# Patient Record
Sex: Female | Born: 1955
Health system: Southern US, Community
[De-identification: ages and names within clinical notes are randomized; demographics above are authoritative.]

## PROBLEM LIST (undated history)

## (undated) DIAGNOSIS — E785 Hyperlipidemia, unspecified: Secondary | ICD-10-CM

## (undated) DIAGNOSIS — K5909 Other constipation: Secondary | ICD-10-CM

## (undated) HISTORY — PX: OTHER SURGICAL HISTORY: SHX169

## (undated) HISTORY — PX: PARTIAL HYSTERECTOMY: SHX80

## (undated) HISTORY — PX: ABDOMINAL HYSTERECTOMY: SHX81

## (undated) HISTORY — PX: CATARACT EXTRACTION, BILATERAL: SHX1313

## (undated) HISTORY — DX: Other constipation: K59.09

---

## 2000-12-19 ENCOUNTER — Encounter: Payer: Self-pay | Admitting: Neurosurgery

## 2000-12-19 ENCOUNTER — Inpatient Hospital Stay (HOSPITAL_COMMUNITY): Admission: AD | Admit: 2000-12-19 | Discharge: 2000-12-20 | Payer: Self-pay | Admitting: Neurosurgery

## 2001-01-05 ENCOUNTER — Encounter: Payer: Self-pay | Admitting: Neurosurgery

## 2001-01-05 ENCOUNTER — Encounter: Admission: RE | Admit: 2001-01-05 | Discharge: 2001-01-05 | Payer: Self-pay | Admitting: Neurosurgery

## 2001-02-09 ENCOUNTER — Encounter: Payer: Self-pay | Admitting: Neurosurgery

## 2001-02-09 ENCOUNTER — Encounter: Admission: RE | Admit: 2001-02-09 | Discharge: 2001-02-09 | Payer: Self-pay | Admitting: Neurosurgery

## 2001-03-19 ENCOUNTER — Encounter (HOSPITAL_COMMUNITY): Admission: RE | Admit: 2001-03-19 | Discharge: 2001-04-18 | Payer: Self-pay | Admitting: Neurosurgery

## 2001-06-03 ENCOUNTER — Encounter: Payer: Self-pay | Admitting: *Deleted

## 2001-06-03 ENCOUNTER — Ambulatory Visit (HOSPITAL_COMMUNITY): Admission: RE | Admit: 2001-06-03 | Discharge: 2001-06-03 | Payer: Self-pay | Admitting: *Deleted

## 2001-12-23 ENCOUNTER — Encounter: Payer: Self-pay | Admitting: Specialist

## 2001-12-23 ENCOUNTER — Ambulatory Visit (HOSPITAL_COMMUNITY): Admission: RE | Admit: 2001-12-23 | Discharge: 2001-12-23 | Payer: Self-pay | Admitting: Specialist

## 2002-12-24 ENCOUNTER — Encounter: Payer: Self-pay | Admitting: Specialist

## 2002-12-24 ENCOUNTER — Ambulatory Visit (HOSPITAL_COMMUNITY): Admission: RE | Admit: 2002-12-24 | Discharge: 2002-12-24 | Payer: Self-pay | Admitting: Specialist

## 2003-10-18 ENCOUNTER — Observation Stay (HOSPITAL_COMMUNITY): Admission: EM | Admit: 2003-10-18 | Discharge: 2003-10-19 | Payer: Self-pay | Admitting: Emergency Medicine

## 2003-10-19 ENCOUNTER — Emergency Department (HOSPITAL_COMMUNITY): Admission: EM | Admit: 2003-10-19 | Discharge: 2003-10-20 | Payer: Self-pay | Admitting: *Deleted

## 2003-12-05 ENCOUNTER — Ambulatory Visit (HOSPITAL_COMMUNITY): Admission: RE | Admit: 2003-12-05 | Discharge: 2003-12-05 | Payer: Self-pay | Admitting: Internal Medicine

## 2003-12-05 HISTORY — PX: COLONOSCOPY: SHX174

## 2004-03-28 ENCOUNTER — Ambulatory Visit (HOSPITAL_COMMUNITY): Admission: RE | Admit: 2004-03-28 | Discharge: 2004-03-28 | Payer: Self-pay | Admitting: Specialist

## 2004-04-04 ENCOUNTER — Ambulatory Visit (HOSPITAL_COMMUNITY): Admission: RE | Admit: 2004-04-04 | Discharge: 2004-04-04 | Payer: Self-pay | Admitting: Internal Medicine

## 2005-04-03 ENCOUNTER — Ambulatory Visit (HOSPITAL_COMMUNITY): Admission: RE | Admit: 2005-04-03 | Discharge: 2005-04-03 | Payer: Self-pay | Admitting: Specialist

## 2006-04-07 ENCOUNTER — Ambulatory Visit (HOSPITAL_COMMUNITY): Admission: RE | Admit: 2006-04-07 | Discharge: 2006-04-07 | Payer: Self-pay | Admitting: Specialist

## 2007-04-13 ENCOUNTER — Ambulatory Visit (HOSPITAL_COMMUNITY): Admission: RE | Admit: 2007-04-13 | Discharge: 2007-04-13 | Payer: Self-pay | Admitting: Specialist

## 2007-05-22 ENCOUNTER — Ambulatory Visit (HOSPITAL_COMMUNITY): Admission: RE | Admit: 2007-05-22 | Discharge: 2007-05-22 | Payer: Self-pay | Admitting: Family Medicine

## 2007-10-10 ENCOUNTER — Ambulatory Visit (HOSPITAL_COMMUNITY): Admission: RE | Admit: 2007-10-10 | Discharge: 2007-10-10 | Payer: Self-pay | Admitting: Family Medicine

## 2008-04-13 ENCOUNTER — Ambulatory Visit (HOSPITAL_COMMUNITY): Admission: RE | Admit: 2008-04-13 | Discharge: 2008-04-13 | Payer: Self-pay | Admitting: Obstetrics & Gynecology

## 2008-04-18 ENCOUNTER — Ambulatory Visit (HOSPITAL_COMMUNITY): Admission: RE | Admit: 2008-04-18 | Discharge: 2008-04-18 | Payer: Self-pay | Admitting: Obstetrics & Gynecology

## 2008-06-14 IMAGING — CR DG CHEST 2V
2 series · 2 of 2 positions shown · non-contrast
Comparison: None.

CLINICAL DATA: Shortness of breath and cough.
CHEST ? 2 VIEW:

[view not recorded (1 of 2)]
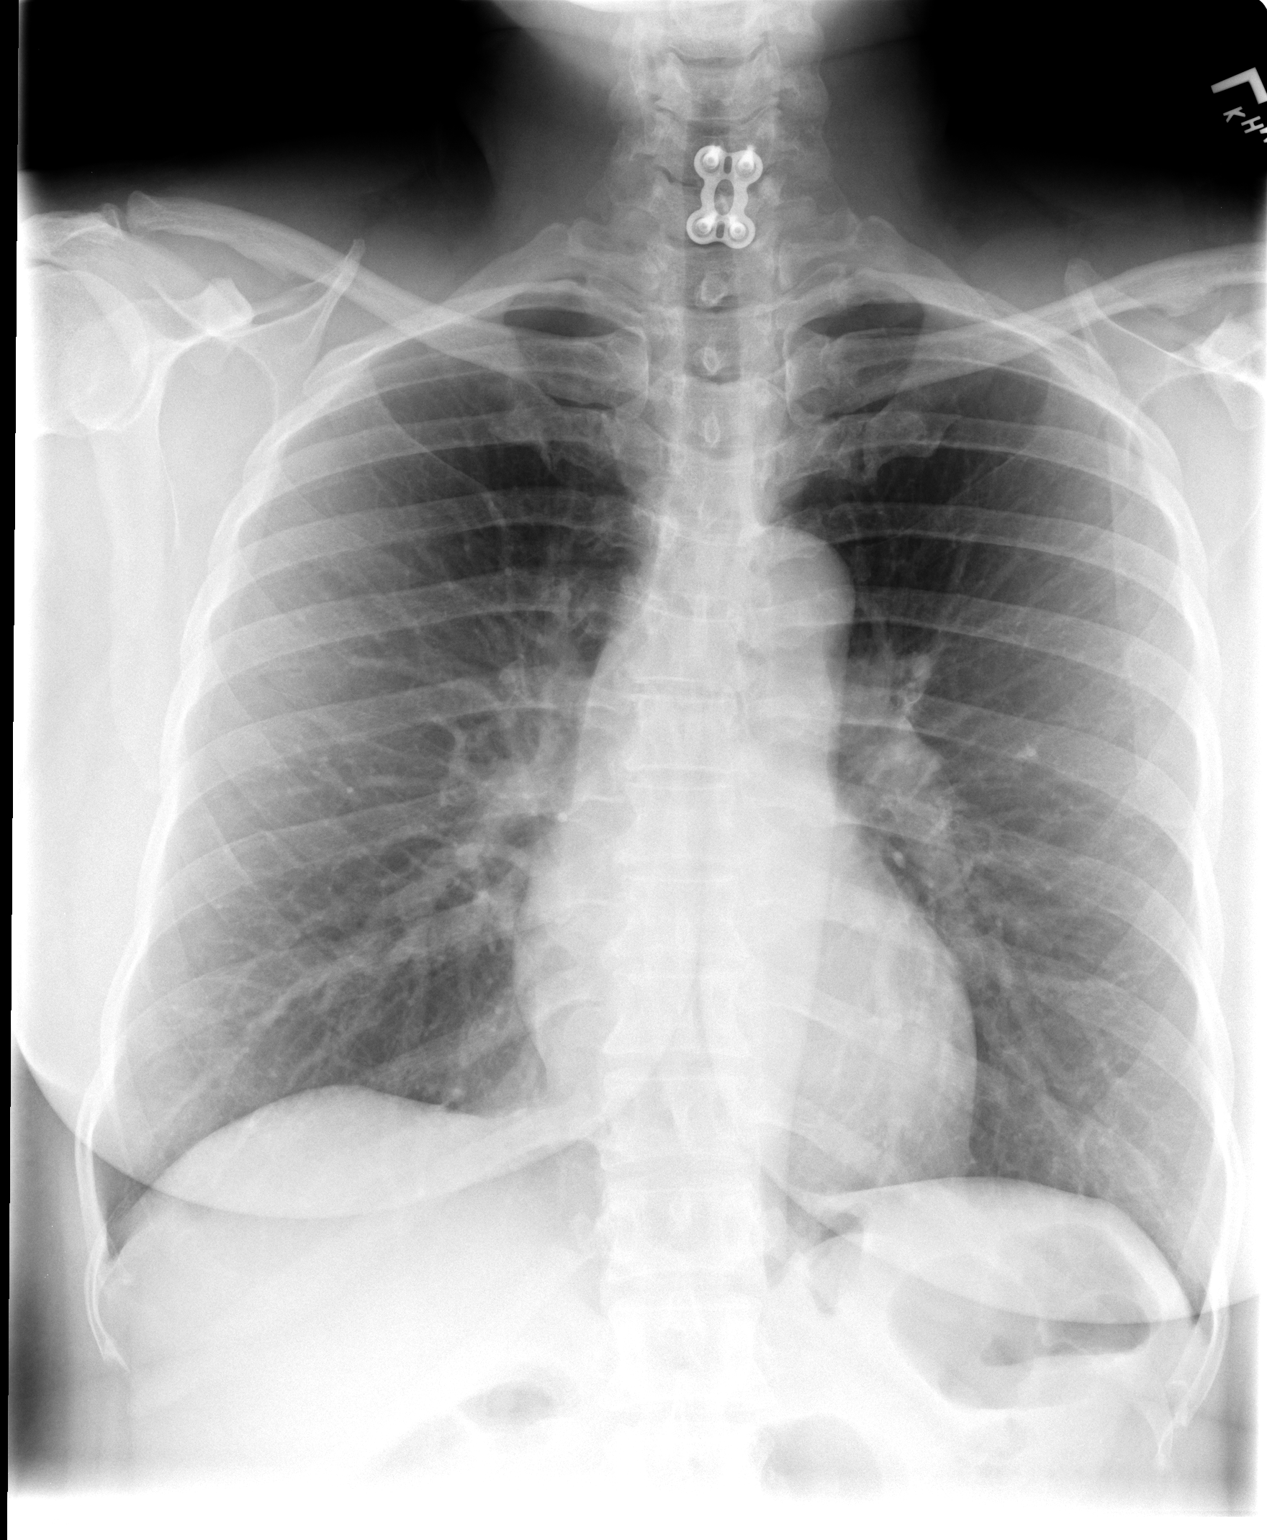

[view not recorded (2 of 2)]
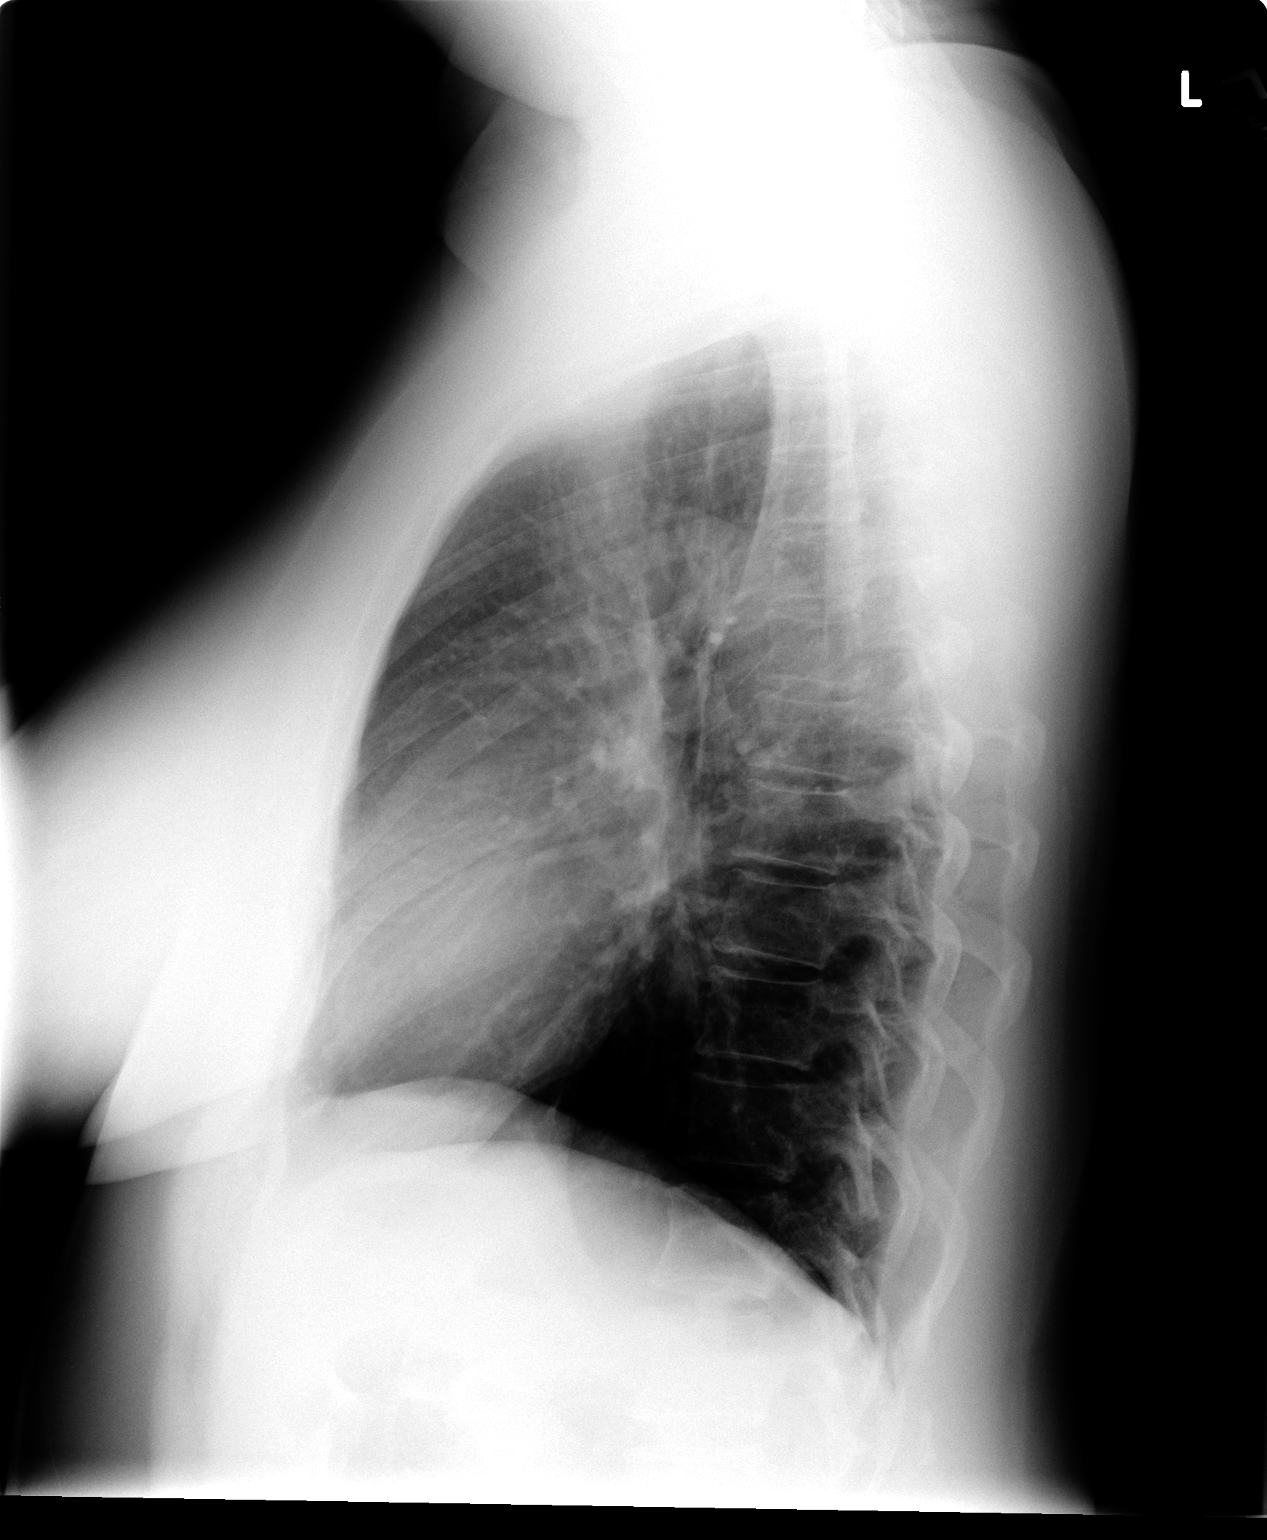

[2 of 2 positions shown; findings below may reference images not displayed]

FINDINGS: Normal heart size and vascular markings. No acute pneumonia, edema, effusion or pneumothorax.  Previous lower cervical fusion hardware.
IMPRESSION: No acute chest disease.

## 2008-10-28 ENCOUNTER — Ambulatory Visit (HOSPITAL_COMMUNITY): Admission: RE | Admit: 2008-10-28 | Discharge: 2008-10-28 | Payer: Self-pay | Admitting: Family Medicine

## 2008-11-28 ENCOUNTER — Ambulatory Visit: Payer: Self-pay | Admitting: Orthopedic Surgery

## 2008-11-28 DIAGNOSIS — M25819 Other specified joint disorders, unspecified shoulder: Secondary | ICD-10-CM | POA: Insufficient documentation

## 2008-11-28 DIAGNOSIS — M7580 Other shoulder lesions, unspecified shoulder: Secondary | ICD-10-CM

## 2008-11-28 DIAGNOSIS — M25519 Pain in unspecified shoulder: Secondary | ICD-10-CM | POA: Insufficient documentation

## 2008-11-29 ENCOUNTER — Encounter: Payer: Self-pay | Admitting: Orthopedic Surgery

## 2008-12-05 ENCOUNTER — Encounter: Payer: Self-pay | Admitting: Orthopedic Surgery

## 2008-12-05 ENCOUNTER — Encounter (HOSPITAL_COMMUNITY): Admission: RE | Admit: 2008-12-05 | Discharge: 2008-12-08 | Payer: Self-pay | Admitting: Orthopedic Surgery

## 2008-12-13 ENCOUNTER — Encounter (HOSPITAL_COMMUNITY): Admission: RE | Admit: 2008-12-13 | Discharge: 2008-12-30 | Payer: Self-pay | Admitting: Orthopedic Surgery

## 2008-12-30 ENCOUNTER — Encounter: Payer: Self-pay | Admitting: Orthopedic Surgery

## 2009-04-28 ENCOUNTER — Ambulatory Visit (HOSPITAL_COMMUNITY): Admission: RE | Admit: 2009-04-28 | Discharge: 2009-04-28 | Payer: Self-pay | Admitting: Obstetrics & Gynecology

## 2010-05-11 ENCOUNTER — Ambulatory Visit (HOSPITAL_COMMUNITY): Admission: RE | Admit: 2010-05-11 | Discharge: 2010-05-11 | Payer: Self-pay | Admitting: Obstetrics & Gynecology

## 2011-04-26 NOTE — Procedures (Signed)
NAME:  ETHYLENE, REZNICK NO.:  [id number]   MEDICAL RECORD NO.:  [id number]                   PATIENT TYPE:  OUT   LOCATION:  RAD                                  FACILITY:  [facility]   PHYSICIAN:  Dani Gobble, MD                    DATE OF BIRTH:  February 15, 1956   DATE OF PROCEDURE:  04/04/2004  DATE OF DISCHARGE:                                  ECHOCARDIOGRAM   INDICATIONS FOR PROCEDURE:  Ms. Merkey is a 55 year old female with a  strong family history of coronary artery disease who has been experiencing  palpitations.   TECHNICAL QUALITY:  Reasonable.   FINDINGS:  The aorta is within normal limits at 2.3 cm.   The left atrium is within normal limits at 0.0 cm. The patient appeared to  be in sinus rhythm during the procedure and no obvious clots or masses were  appreciated.   Interventricular septum and posterior wall were within normal limits at 1.0  cm and 0.9 cm, respectively.   The aortic valve was not well visualized but appeared to have normal leaflet  excursion. No aortic insufficiency was noted. Doppler interrogation of the  aortic valve was within normal limits.   Mitral valve appeared minimally thickened but with normal leaflet excursion.  No mitral valve prolapse was noted. No significant mitral regurgitation was  noted. Doppler interrogation of the mitral valve was within normal limits.   The pulmonic valve was not well visualized.   The tricuspid valve appeared grossly structurally normal with trace to mild  tricuspid regurgitation noted.   The left ventricle was normal in size with the LVIDD measured at 4.0 cm and  the LVISD measured at 3.3 cm. Overall left ventricular systolic function was  normal. In 1 view only, there appeared to be subtle lateral wall  hypokinesis, however, this was not appreciated in any other view. The right  ventricle appeared mildly generous but with preserved right ventricular  systolic function. The right  atrium was also normal in size.   IMPRESSION:  1. Trace to mild tricuspid regurgitation.  2. Normal left ventricular size and normal left ventricular systolic     function:  In 1 view only there was a suggestion     of subtle lateral wall hypokinesis, however, this was not appreciated in     any other view.  3. The right ventricle appeared mildly generous but with preserved right     ventricular systolic function.  4. No mitral valve prolapse was appreciated.      ___________________________________________                                            Dani Gobble, MD   AB/MEDQ  D:  04/05/2004  T:  04/05/2004  Job:  [id number] 

## 2011-04-26 NOTE — H&P (Signed)
NAME:  Samantha Clarke, Samantha Clarke                   ACCOUNT NO.:  [id number]   MEDICAL RECORD NO.:  [id number]                  PATIENT TYPE:   LOCATION:                                       FACILITY:   PHYSICIAN:  R. Roetta Sessions, M.D.              DATE OF BIRTH:  1956/07/11   DATE OF ADMISSION:  DATE OF DISCHARGE:                                HISTORY & PHYSICAL   CHIEF COMPLAINT:  Follow up of hospitalization, colonoscopy.   HISTORY OF PRESENT ILLNESS:  The patient is a 55 year old black female who  we recently saw in the hospital with complaints of nausea, vomiting, fever.  She had a temperature of 103.3 on admission.  CT of the abdomen and pelvis  revealed diffuse fatty infiltration of the liver and marked constipation,  but no other abnormalities.  The patient was felt to have significant  constipation/obstipation.  She was recommended to have follow up colonoscopy  as an outpatient.  She presents today stating that Zelnorm 6 mg b.i.d. is  helping some.  She is now having a bowel movement two times weekly.  If she  skips three days, she will take MiraLax or milk of magnesia.  She denies any  melena or rectal bleeding.  Denies any abdominal pain, nausea, or vomiting.   CURRENT MEDICATIONS:  1. MiraLax 17 gm p.r.n.  2. Zelnorm 6 mg b.i.d.  3. Milk of magnesia p.r.n.   ALLERGIES:  She is allergic to an antibiotic which was given to her recently  in the hospital.  She developed swelling and rash.  She will call back with  this medication.   PAST MEDICAL HISTORY:  Chronic constipation.   PAST SURGICAL HISTORY:  1. Anterior cervical diskectomy with fusion of C6 to C7.  2. Partial hysterectomy due to fibroids.  3. Bilateral cataract surgery.   FAMILY HISTORY:  Mother was diagnosed with ovarian cancer in 1986; doing  well.  No family history of colorectal cancer.   SOCIAL HISTORY:  She is single.  She has one child.  Denies any tobacco or  alcohol use.   REVIEW OF SYSTEMS:   Please see HPI for GI.  GENERAL:  Denies any weight  loss.  CARDIOPULMONARY:  Denies any chest pain or shortness of breath.   PHYSICAL EXAMINATION:  VITAL SIGNS:  Height 5'5, weight 169.  Blood  pressure 110/70, pulse 64.  GENERAL:  A pleasant, well-nourished, well-developed, black female in no  acute distress.  SKIN:  Warm and dry, no jaundice.  HEENT:  Conjunctivae are pink.  Sclerae are anicteric.  Oropharynx moist and  pink.  No lesions, erythema, or exudate.  No lymphadenopathy or thyromegaly.  CHEST:  Lungs clear to auscultation.  CARDIAC EXAM:  Regular rate and rhythm.  Normal S1 and S2.  No murmurs,  rubs, or gallops.  ABDOMEN:  Positive bowel sounds, soft, nontender, nondistended.  No  organomegaly or masses.  EXTREMITIES:  No edema.   IMPRESSION:  Chronic constipation with recent hospitalization for  obstipation.  Her temperature was 103.3 on admission.  It is unclear if she  was ever determined to have a source of infection.  The patient states she  was given an antibiotic, but does not recall any specifics as to what was  being treated.  Given the significant constipation requiring  hospitalization, would go ahead and recommend colonoscopy at this point.  Notably, she did have a TSH while in the hospital which was normal.   PLAN:  1. Colonoscopy in the near future.  2. Will go ahead and provide her with a two-day prep with four Dulcolax     tablets two day prior to procedure, followed by GoLYTELY the day before     the procedure, in hopes to have her colon to be cleansed.  3. Further recommendations to follow.  4. She will call back with the name of the antibiotic that she is allergic     to.     _____________________________________  ___________________________________________  Tana Coast, P.AJonathon Bellows, M.D.   LL/MEDQ  D:  11/08/2003  T:  11/08/2003  Job:  [id number]   cc:   Madelin Rear. Sherwood Gambler, M.D.  [address]  [place]  [place]  [address]  Fax: [phone]

## 2011-04-26 NOTE — Op Note (Signed)
NAME:  Samantha Clarke, Samantha Clarke                         ACCOUNT NO.:  [id number]   MEDICAL RECORD NO.:  [id number]                   PATIENT TYPE:  AMB   LOCATION:  DAY                                  FACILITY:  [facility]   PHYSICIAN:  Lionel December, M.D.                 DATE OF BIRTH:  Jun 01, 1956   DATE OF PROCEDURE:  DATE OF DISCHARGE:                                 OPERATIVE REPORT   PROCEDURE:  Total colonoscopy.   ENDOSCOPIST:  Lionel December, M.D.   INDICATIONS:  This patient is a 55 year old African-American female who was  recently hospitalized with nausea and vomiting and felt to have  constipation/obstipation. She responded to medical therapy. She is now  returning for a colonoscopy.  She is doing a lot better since she has been  on Zelnorm along with a high fiber diet and having to take MiraLax on a  p.r.n. basis.  The procedure and risks were reviewed with the patient and  informed consent was obtained.   PREOPERATIVE MEDICATIONS:  Demerol 25 mg IV and Versed 4 mg IV in divided  dose.   FINDINGS:  Procedure performed in endoscopy suite.  The patient's vital  signs and O2 saturation were monitored during the procedure and remained  stable.  The patient was placed in the left lateral recumbent position and  rectal examination was performed.  No abnormality noted on external or  digital exam.   Olympus videoscope was placed in the rectum and advanced under vision into  the sigmoid colon and beyond.  Preparation was excellent.  Redundant colon  with mild pigmentation or melanosis coli of the proximal colon.  The scope  was advanced into the cecum which was identified by appendiceal orifice and  ileocecal valve.  Pictures taken for the record. As the scope was withdrawn  the colonic mucosa was carefully examined and was normal throughout.  Rectal  mucosa similarly was normal.   The scope was retroflexed to examine the anorectal junction and small  hemorrhoids were noted below the  dentate line.  The endoscope was  straightened and withdrawn.  The patient tolerated the procedure well.   FINAL DIAGNOSIS:  Small external hemorrhoids, otherwise, normal but  redundant colon with mild melanosis coli.   RECOMMENDATIONS:  She will continue high fiber diet and Zelnorm as before.      ___________________________________________                                            Lionel December, M.D.   NR/MEDQ  D:  12/05/2003  T:  12/05/2003  Job:  [id number]   cc:   Patrica Duel, M.D.  [address]  [place]  [place] [address]  Fax: [phone]

## 2011-04-26 NOTE — Consult Note (Signed)
NAME:  Samantha Clarke, Samantha Clarke                         ACCOUNT NO.:  [id number]   MEDICAL RECORD NO.:  [id number]                   PATIENT TYPE:  OBV   LOCATION:  A321                                 FACILITY:  [facility]   PHYSICIAN:  Lionel December, M.D.                 DATE OF BIRTH:  03-06-1956   DATE OF CONSULTATION:  10/18/2003  DATE OF DISCHARGE:                                   CONSULTATION   REQUESTING PHYSICIAN:  Patrica Duel, M.D.   REASON FOR CONSULTATION:  Possible colonoscopy.   HISTORY OF PRESENT ILLNESS:  The patient is a pleasant, 55 year old black  female who presented to the emergency department via EMS early this a.m.  with complaints of nausea, vomiting, fever, sore throat and dizziness.  In  the emergency department she was found to have a temperature of 103.3.  White blood cell count was 11,800.  Hemoglobin was 12.6, hematocrit 38.6,  platelets 200,000.  LFTs normal except for albumin 3.2.  Normal amylase and  lipase.  BUN and creatinine were normal.  Urinalysis was negative as well as  a group A Strep  was negative.  She had a CT of the abdomen and pelvis which  revealed diffuse fatty infiltration of the liver and marked constipation,  but no other abnormalities.   The patient states that she has chronic constipation.  She was seen earlier  this year by Dr. Jena Gauss.  She has been on MiraLax daily with no relief.  She  generally will have 1-2 bowel movements weekly on a good week, sometimes it  will go up to 2 weeks at a time without a bowel movement.  She did pass a  small bowel movement yesterday.  Otherwise it had been more 8 days without a  bowel movement.  She denies any melena or rectal bleeding.  Denies any  abdominal pain, although she has had some cramping.  Nausea and vomiting  began last night and has now resolved.  She has no real contact.  She has  never had a colonoscopy.   MEDICATIONS PRIOR TO ADMISSION:  1. Flexeril 5 mg t.i.d.  2. Medrol Dosepak.  3.  MiraLax 17 gm daily.  4. Enema p.r.n.   ALLERGIES:  No known drug allergies.   PAST MEDICAL HISTORY:  Chronic constipation.   PAST SURGICAL HISTORY:  1. Anterior cervical discectomy with fusion of C6 to C7.  2. Partial hysterectomy due to fibroids.  3. Bilateral cataract surgery.   FAMILY HISTORY:  Mother was diagnosed with ovarian cancer in 1986, doing  very well.  No family history of colorectal cancer.   SOCIAL HISTORY:  She is single.  She has one child.  She denies any tobacco  or alcohol use.   REVIEW OF SYSTEMS:  Please see HPI for GI and for HEENT.  GENERAL:  Denies  any weight loss.   PHYSICAL EXAMINATION:  VITAL SIGNS:  Height 5 feet 5 inches.  Weight 165.6.  T-max 103.3, T-current 99, blood pressure 88/56, pulse 76, respirations 24.  GENERAL:  A pleasant, well-nourished, well-developed, black female in no  acute distress.  SKIN:  Warm and dry.  No jaundice.  HEENT:  Pupils are equal, round, and reactive to light.  Conjunctivae are  pink.  Sclerae anicteric.  Oropharyngeal mucosa moist and pink.  No lesions,  erythema or exudate.  No lymphadenopathy or thyromegaly.  CHEST:  Lungs are clear to auscultation.  CARDIOVASCULAR:  Cardiac exam reveals regular rate and rhythm.  Normal S1,  S2.  No murmurs, rubs, or gallops.  ABDOMEN:  Positive bowel sounds, soft, nontender, nondistended. No  organomegaly or masses.  RECTAL:  Examination in the ED revealed soft stool, Hemoccult negative.  EXTREMITIES:  No edema.   LABS:  On admission see HPI.  In addition, sodium 138, potassium 3.7, BUN  14, creatinine 1.9, glucose 109, total bilirubin 0.6, alkaline phosphatase  47, SGOT 21, SGPT 21, amylase 66, lipase 37.  Blood cultures and urine  cultures pending.   IMPRESSION:  The patient is a pleasant, 55 year old black female with  history of chronic constipation and with obstipation.  She had a temperature  of 103.3 on admission.  It is unclear if this is related to obstipation,   would be concerned about other source, i.e. infection, none have been found.  Blood and urine cultures are pending.  It is possible that she could have  some fever due to distention of the gut with impaired primary mobility,  absorption of ____________/toxins.   SUGGESTIONS:  1. Check TSH.  2. Would recommend colonoscopy, however, would opt to do this as an     outpatient.  3. Continue enema as needed; however, would consider Fleet's or tap water     enema instead.  When the patient is discharged would recommend Zelnorm 6     mg p.o. b.i.d. (30 minutes before breakfast and evening meal) along with     fiber supplement daily (fiber of choice 2 tablets daily), fleet enema     every fourth day if no bowel movement.  4.     Office visit with [place] in 4 weeks and will have the patient bring a stool     diary.  Would arrange for a colonoscopy at that point.   I would like to thank Dr. Patrica Duel for allowing [place] to take part in the  care of this patient.     ________________________________________  ___________________________________________  Tana Coast, P.A.                         Lionel December, M.D.   LL/MEDQ  D:  10/18/2003  T:  10/18/2003  Job:  [id number]   cc:   Patrica Duel, M.D.  [address]  [place]  [place] [address]  Fax: [phone]

## 2011-05-21 ENCOUNTER — Other Ambulatory Visit (HOSPITAL_COMMUNITY): Payer: Self-pay | Admitting: Family Medicine

## 2011-05-21 DIAGNOSIS — Z139 Encounter for screening, unspecified: Secondary | ICD-10-CM

## 2011-05-30 ENCOUNTER — Ambulatory Visit (HOSPITAL_COMMUNITY)
Admission: RE | Admit: 2011-05-30 | Discharge: 2011-05-30 | Disposition: A | Discharge status: Home / Self Care | Payer: PRIVATE HEALTH INSURANCE | Source: Ambulatory Visit | Attending: Family Medicine | Admitting: Family Medicine

## 2011-05-30 ENCOUNTER — Other Ambulatory Visit (HOSPITAL_COMMUNITY): Payer: Self-pay | Admitting: Internal Medicine

## 2011-05-30 DIAGNOSIS — Z139 Encounter for screening, unspecified: Secondary | ICD-10-CM

## 2011-05-30 DIAGNOSIS — Z1231 Encounter for screening mammogram for malignant neoplasm of breast: Secondary | ICD-10-CM | POA: Insufficient documentation

## 2012-06-29 ENCOUNTER — Other Ambulatory Visit (HOSPITAL_COMMUNITY): Payer: Self-pay | Admitting: Family Medicine

## 2012-06-29 DIAGNOSIS — Z139 Encounter for screening, unspecified: Secondary | ICD-10-CM

## 2012-07-02 ENCOUNTER — Ambulatory Visit (HOSPITAL_COMMUNITY)
Admission: RE | Admit: 2012-07-02 | Discharge: 2012-07-02 | Disposition: A | Discharge status: Home / Self Care | Payer: PRIVATE HEALTH INSURANCE | Source: Ambulatory Visit | Attending: Family Medicine | Admitting: Family Medicine

## 2012-07-02 DIAGNOSIS — Z139 Encounter for screening, unspecified: Secondary | ICD-10-CM

## 2012-07-02 DIAGNOSIS — Z1231 Encounter for screening mammogram for malignant neoplasm of breast: Secondary | ICD-10-CM | POA: Insufficient documentation

## 2012-07-25 LAB — COMPREHENSIVE METABOLIC PANEL
Albumin: 4
BUN: 9 mg/dL (ref 4–21)
CO2: 28 mmol/L
Chloride: 105 mmol/L
Creat: 0.87
Glucose: 92
Total Protein: 7.1 g/dL

## 2012-10-05 ENCOUNTER — Encounter: Payer: Self-pay | Admitting: Internal Medicine

## 2012-10-06 ENCOUNTER — Ambulatory Visit (INDEPENDENT_AMBULATORY_CARE_PROVIDER_SITE_OTHER): Payer: PRIVATE HEALTH INSURANCE | Admitting: Urgent Care

## 2012-10-06 ENCOUNTER — Encounter: Payer: Self-pay | Admitting: Urgent Care

## 2012-10-06 VITALS — BP 125/83 | HR 63 | Temp 98.4°F | Ht 65.0 in | Wt 172.0 lb

## 2012-10-06 DIAGNOSIS — K5900 Constipation, unspecified: Secondary | ICD-10-CM

## 2012-10-06 DIAGNOSIS — K5909 Other constipation: Secondary | ICD-10-CM

## 2012-10-06 MED ORDER — LINACLOTIDE 145 MCG PO CAPS: 145.0000 ug | ORAL_CAPSULE | Freq: Every day | ORAL | Status: DC

## 2012-10-06 MED ORDER — PEG 3350-KCL-NA BICARB-NACL 420 G PO SOLR: 4000.0000 mL | ORAL | Status: DC

## 2012-10-06 NOTE — Assessment & Plan Note (Signed)
Samantha Clarke is a pleasant 56 y.o. female with change in bowel habits.  Underlying chronic constipation, however now she is laxative dependent.  Failed standard constipation treatment including miralax, fiber, & OTC laxatives.  Last colonoscopy 9 years ago.  Colonoscopy with Dr Darrick Penna.  I have discussed risks & benefits which include, but are not limited to, bleeding, infection, perforation & drug reaction.  The patient agrees with this plan & written consent will be obtained.    Begin Linzess daily Constipation literature

## 2012-10-06 NOTE — Progress Notes (Signed)
Labs reviewed from Aug 2013 Dr Loleta Chance- No TSH drawn Met 7 & LFTS normal  Please call pt & offer TSH to check thyroid as it was not done at PCP Thanks

## 2012-10-06 NOTE — Addendum Note (Signed)
Addended by: Joselyn Arrow on: 10/06/2012 04:47 PM   Modules accepted: Orders

## 2012-10-06 NOTE — Progress Notes (Signed)
Referring Provider: Mirna Mires, MD Primary Care Physician:  Evlyn Courier, MD Primary Gastroenterologist:  Dr. Jonette Eva  Chief Complaint  Patient presents with  . Constipation  . Colonoscopy    HPI:  Samantha Clarke is a 56 y.o. female here as a referral from Dr. Loleta Chance for screening colonoscopy.  Upon triage, she was noted to have constipation.  She has hx lifelong constipation, but has been progressively worse over the last few years.  Now, about every 7 days, she has takes an OTC laxative to have a BM.  She has tried miralax, metamucil, & benefiber.  Dulcolax works the best.  She believes she had a normal TSH 6 mo ago at her PCP.  Denies rectal bleeding or melena.  Denies abdominal pain.  Denies heartburn, indigestion, nausea, vomiting, dysphagia, odynophagia or anorexia.  Weight has been stable.  Last colonoscopy by Dr Karilyn Cota 2004.    Past Medical History  Diagnosis Date  . Chronic constipation     Past Surgical History  Procedure Date  . Colonoscopy 12/05/2003    Rehman-Small external hemorrhoids, otherwise, normal but redundant colon with mild melanosis coli  . Anterior cervical diskectomy     with fusion of C6 to C7  . Partial hysterectomy   . Cataract extraction, bilateral     Current Outpatient Prescriptions  Medication Sig Dispense Refill  . aspirin 81 MG tablet Take 81 mg by mouth daily.      Marland Kitchen BIOTIN PO Take by mouth daily.      . fish oil-omega-3 fatty acids 1000 MG capsule Take 2 g by mouth daily.      . Linaclotide (LINZESS) 145 MCG CAPS Take 1 capsule (145 mcg total) by mouth daily.  12 capsule  0  . Linaclotide (LINZESS) 145 MCG CAPS Take 1 capsule (145 mcg total) by mouth daily.  30 capsule  5    Allergies as of 10/06/2012 - Review Complete 10/06/2012  Allergen Reaction Noted  . Ceftriaxone sodium Swelling     Family History:There is no known family history of colorectal carcinoma , liver disease, or inflammatory bowel disease.  Problem Relation Age of  Onset  . Heart failure Mother   . Stroke Father     History   Social History  . Marital Status: Divorced    Spouse Name: N/A    Number of Children: 1  . Years of Education: N/A   Occupational History  . textiles    Social History Main Topics  . Smoking status: Never Smoker   . Smokeless tobacco: Not on file  . Alcohol Use: No  . Drug Use: No  . Sexually Active: Not on file  Review of Systems: Gen: Denies any fever, chills, sweats, anorexia, fatigue, weakness, malaise, weight loss, and sleep disorder CV: Denies chest pain, angina, palpitations, syncope, orthopnea, PND, peripheral edema, and claudication. Resp: Denies dyspnea at rest, dyspnea with exercise, cough, sputum, wheezing, coughing up blood, and pleurisy. GI: Denies vomiting blood, jaundice, and fecal incontinence.   Denies dysphagia or odynophagia. GU : Denies urinary burning, blood in urine, urinary frequency, urinary hesitancy, nocturnal urination, and urinary incontinence. MS: Denies joint pain, limitation of movement, and swelling, stiffness, low back pain, extremity pain. Denies muscle weakness, cramps, atrophy.  Derm: Denies rash, itching, dry skin, hives, moles, warts, or unhealing ulcers.  Psych: Denies depression, anxiety, memory loss, suicidal ideation, hallucinations, paranoia, and confusion. Heme: Denies bruising, bleeding, and enlarged lymph nodes. Neuro:  Denies any headaches, dizziness, paresthesias. Endo:  Denies any problems with DM, thyroid, adrenal function.  Physical Exam: BP 125/83  Pulse 63  Temp 98.4 F (36.9 C) (Temporal)  Ht 5\' 5"  (1.651 m)  Wt 172 lb (78.019 kg)  BMI 28.62 kg/m2 No LMP recorded. Patient has had a hysterectomy. General:   Alert,  Well-developed, well-nourished, pleasant and cooperative in NAD Head:  Normocephalic and atraumatic. Eyes:  Sclera clear, no icterus.   Conjunctiva pink. Ears:  Normal auditory acuity. Nose:  No deformity, discharge, or lesions. Mouth:  No  deformity or lesions,oropharynx pink & moist. Neck:  Supple; no masses or thyromegaly. Lungs:  Clear throughout to auscultation.   No wheezes, crackles, or rhonchi. No acute distress. Heart:  Regular rate and rhythm; no murmurs, clicks, rubs,  or gallops. Abdomen:  Normal bowel sounds.  No bruits.  Soft, non-tender and non-distended without masses, hepatosplenomegaly or hernias noted.  No guarding or rebound tenderness.   Rectal:  Deferred. Msk:  Symmetrical without gross deformities. Normal posture. Pulses:  Normal pulses noted. Extremities:  No clubbing or edema. Neurologic:  Alert and oriented x4;  grossly normal neurologically. Skin:  Intact without significant lesions or rashes. Lymph Nodes:  No significant cervical adenopathy. Psych:  Alert and cooperative. Normal mood and affect.

## 2012-10-06 NOTE — Patient Instructions (Addendum)
Colonoscopy with Dr Sena Slate daily for constipation   Constipation, Adult Constipation is when a person has fewer than 3 bowel movements a week; has difficulty having a bowel movement; or has stools that are dry, hard, or larger than normal. As people grow older, constipation is more common. If you try to fix constipation with medicines that make you have a bowel movement (laxatives), the problem may get worse. Long-term laxative use may cause the muscles of the colon to become weak. A low-fiber diet, not taking in enough fluids, and taking certain medicines may make constipation worse. CAUSES   Certain medicines, such as antidepressants, pain medicine, iron supplements, antacids, and water pills.   Certain diseases, such as diabetes, irritable bowel syndrome (IBS), thyroid disease, or depression.   Not drinking enough water.   Not eating enough fiber-rich foods.   Stress or travel.  Lack of physical activity or exercise.  Not going to the restroom when there is the urge to have a bowel movement.  Ignoring the urge to have a bowel movement.  Using laxatives too much. SYMPTOMS   Having fewer than 3 bowel movements a week.   Straining to have a bowel movement.   Having hard, dry, or larger than normal stools.   Feeling full or bloated.   Pain in the lower abdomen.  Not feeling relief after having a bowel movement. DIAGNOSIS  Your caregiver will take a medical history and perform a physical exam. Further testing may be done for severe constipation. Some tests may include:   A barium enema X-ray to examine your rectum, colon, and sometimes, your small intestine.  A sigmoidoscopy to examine your lower colon.  A colonoscopy to examine your entire colon. TREATMENT  Treatment will depend on the severity of your constipation and what is causing it. Some dietary treatments include drinking more fluids and eating more fiber-rich foods. Lifestyle treatments may  include regular exercise. If these diet and lifestyle recommendations do not help, your caregiver may recommend taking over-the-counter laxative medicines to help you have bowel movements. Prescription medicines may be prescribed if over-the-counter medicines do not work.  HOME CARE INSTRUCTIONS   Increase dietary fiber in your diet, such as fruits, vegetables, whole grains, and beans. Limit high-fat and processed sugars in your diet, such as [place] fries, hamburgers, cookies, candies, and soda.   A fiber supplement may be added to your diet if you cannot get enough fiber from foods.   Drink enough fluids to keep your urine clear or pale yellow.   Exercise regularly or as directed by your caregiver.   Go to the restroom when you have the urge to go. Do not hold it.  Only take medicines as directed by your caregiver. Do not take other medicines for constipation without talking to your caregiver first. SEEK IMMEDIATE MEDICAL CARE IF:   You have bright red blood in your stool.   Your constipation lasts for more than 4 days or gets worse.   You have abdominal or rectal pain.   You have thin, pencil-like stools.  You have unexplained weight loss. MAKE SURE YOU:   Understand these instructions.  Will watch your condition.  Will get help right away if you are not doing well or get worse. Document Released: 08/23/2004 Document Revised: 02/17/2012 Document Reviewed: 10/29/2011 [facility] Patient Information 2013 [place], [place].

## 2012-10-07 NOTE — Progress Notes (Signed)
Faxed to PCP

## 2012-10-07 NOTE — Progress Notes (Addendum)
Called, LMOM to call. Faxed the order for TSH to [facility]. Pt returned call and asked me to mail the order to her and she will have PCP do it.

## 2012-10-20 ENCOUNTER — Encounter (HOSPITAL_COMMUNITY): Payer: Self-pay | Admitting: Pharmacy Technician

## 2012-10-26 ENCOUNTER — Encounter (HOSPITAL_COMMUNITY)
Admission: RE | Disposition: A | Discharge status: Home / Self Care | Payer: Self-pay | Source: Ambulatory Visit | Attending: Gastroenterology

## 2012-10-26 ENCOUNTER — Encounter (HOSPITAL_COMMUNITY): Payer: Self-pay | Admitting: *Deleted

## 2012-10-26 ENCOUNTER — Ambulatory Visit (HOSPITAL_COMMUNITY)
Admission: RE | Admit: 2012-10-26 | Discharge: 2012-10-26 | Disposition: A | Discharge status: Home / Self Care | Payer: PRIVATE HEALTH INSURANCE | Source: Ambulatory Visit | Attending: Gastroenterology | Admitting: Gastroenterology

## 2012-10-26 DIAGNOSIS — Z1211 Encounter for screening for malignant neoplasm of colon: Secondary | ICD-10-CM

## 2012-10-26 DIAGNOSIS — D126 Benign neoplasm of colon, unspecified: Secondary | ICD-10-CM

## 2012-10-26 DIAGNOSIS — K5909 Other constipation: Secondary | ICD-10-CM

## 2012-10-26 DIAGNOSIS — K648 Other hemorrhoids: Secondary | ICD-10-CM

## 2012-10-26 DIAGNOSIS — D129 Benign neoplasm of anus and anal canal: Secondary | ICD-10-CM | POA: Insufficient documentation

## 2012-10-26 DIAGNOSIS — D128 Benign neoplasm of rectum: Secondary | ICD-10-CM | POA: Insufficient documentation

## 2012-10-26 HISTORY — PX: COLONOSCOPY: SHX5424

## 2012-10-26 SURGERY — COLONOSCOPY
Anesthesia: Moderate Sedation

## 2012-10-26 MED ORDER — MIDAZOLAM HCL 5 MG/5ML IJ SOLN
INTRAMUSCULAR | Status: AC
  Filled 2012-10-26: qty 10

## 2012-10-26 MED ORDER — MEPERIDINE HCL 100 MG/ML IJ SOLN
INTRAMUSCULAR | Status: AC
  Filled 2012-10-26: qty 1

## 2012-10-26 MED ORDER — SODIUM CHLORIDE 0.45 % IV SOLN
INTRAVENOUS | Status: DC
  Administered 2012-10-26: 08:00:00 via INTRAVENOUS

## 2012-10-26 MED ORDER — MIDAZOLAM HCL 5 MG/5ML IJ SOLN
INTRAMUSCULAR | Status: DC | PRN
  Administered 2012-10-26 (×2): 2 mg via INTRAVENOUS

## 2012-10-26 MED ORDER — MEPERIDINE HCL 100 MG/ML IJ SOLN
INTRAMUSCULAR | Status: DC | PRN
  Administered 2012-10-26: 25 mg via INTRAVENOUS
  Administered 2012-10-26: 50 mg via INTRAVENOUS

## 2012-10-26 MED ORDER — STERILE WATER FOR IRRIGATION IR SOLN
Status: DC | PRN
  Administered 2012-10-26: 09:00:00

## 2012-10-26 NOTE — Op Note (Signed)
[facility] [address] [place], [address]   COLONOSCOPY PROCEDURE REPORT  PATIENT: Samantha Clarke, Samantha Clarke  MR#: [id number] BIRTHDATE: 1956/02/12 , 56  yrs. old GENDER: Female ENDOSCOPIST: Jonette Eva, MD REFERRED ON:GEXBMW Hill, M.D. PROCEDURE DATE:  10/26/2012 PROCEDURE:   Colonoscopy with biopsy  WITH OVERTUBE INDICATIONS:average risk patient for colon cancer. MEDICATIONS: Demerol 75 mg IV and Versed 4 mg IV  DESCRIPTION OF PROCEDURE:    Physical exam was performed.  Informed consent was obtained from the patient after explaining the benefits, risks, and alternatives to procedure.  The patient was connected to monitor and placed in left lateral position. Continuous oxygen was provided by nasal cannula and IV medicine administered through an indwelling cannula.  After administration of sedation and rectal exam, the patients rectum was intubated and the EC-[id number] ([id number])  colonoscope was advanced under direct visualization to the cecum.  The scope was removed slowly by carefully examining the color, texture, anatomy, and integrity mucosa on the way out.  The patient was recovered in endoscopy and discharged home in satisfactory condition.       COLON FINDINGS: Three polyps measuring 2-4 mm in size were found in the ascending colon and rectum.  A polypectomy was performed with cold forceps.  , The colon mucosa was otherwise normal.  , Small internal hemorrhoids were found.  , and The TRANSVERSE colon was redundant & COLONIC OVERTUBE WAS USED TO SUCCESSFULLY INTUBATE THE CECUM.  PREP QUALITY: good. CECAL W/D TIME: 19 minutes  COMPLICATIONS: None  ENDOSCOPIC IMPRESSION: 1.   Three polyps measuring 2-4 mm in size were found in the ascending colon and rectum; polypectomy was performed with cold forceps 2.   The colon mucosa was otherwise normal 3.   Small internal hemorrhoids   RECOMMENDATIONS: AWAIT BIOPSY HIGH FIBER DIET TCS IN 10 YEARS WITH  OVERTUBE       _______________________________ eSignedJonette Eva, MD 10/26/2012 9:31 AM     PATIENT NAME:  Samantha Clarke MR#: [id number]

## 2012-10-26 NOTE — H&P (Signed)
  Primary Care Physician:  Evlyn Courier, MD Primary Gastroenterologist:  Dr. Darrick Penna  Pre-Procedure History & Physical: HPI:  Samantha Clarke is a 56 y.o. female here for COLON CANCER SCREENING.   Past Medical History  Diagnosis Date  . Chronic constipation     Past Surgical History  Procedure Date  . Colonoscopy 12/05/2003    Rehman-Small external hemorrhoids, otherwise, normal but redundant colon with mild melanosis coli  . Anterior cervical diskectomy     with fusion of C6 to C7  . Partial hysterectomy   . Cataract extraction, bilateral   . Abdominal hysterectomy   . Right finger surgery     Prior to Admission medications   Medication Sig Start Date End Date Taking? Authorizing Provider  BIOTIN PO Take 1 tablet by mouth daily.    Yes Historical Provider, MD  fish oil-omega-3 fatty acids 1000 MG capsule Take 1 g by mouth daily.    Yes Historical Provider, MD  Linaclotide Karlene Einstein) 145 MCG CAPS Take 1 capsule (145 mcg total) by mouth daily. 10/06/12  Yes Joselyn Arrow, NP  polyethylene glycol-electrolytes (TRILYTE) 420 G solution Take 4,000 mLs by mouth as directed. 10/06/12  Yes West Bali, MD  aspirin 81 MG tablet Take 81 mg by mouth daily.    Historical Provider, MD    Allergies as of 10/06/2012 - Review Complete 10/06/2012  Allergen Reaction Noted  . Ceftriaxone sodium Swelling     Family History  Problem Relation Age of Onset  . Heart failure Mother   . Stroke Father   . Colon cancer Neg Hx     History   Social History  . Marital Status: Divorced    Spouse Name: N/A    Number of Children: 1  . Years of Education: N/A   Occupational History  . textiles    Social History Main Topics  . Smoking status: Never Smoker   . Smokeless tobacco: Not on file  . Alcohol Use: No  . Drug Use: No  . Sexually Active: Not on file   Other Topics Concern  . Not on file   Social History Narrative   Lives alone    Review of Systems: See HPI, otherwise  negative ROS   Physical Exam: BP 129/81  Pulse 61  Temp 98 F (36.7 C) (Oral)  Resp 19  Ht 5\' 5"  (1.651 m)  Wt 172 lb (78.019 kg)  BMI 28.62 kg/m2  SpO2 94% General:   Alert,  pleasant and cooperative in NAD Head:  Normocephalic and atraumatic. Neck:  Supple; Lungs:  Clear throughout to auscultation.    Heart:  Regular rate and rhythm. Abdomen:  Soft, nontender and nondistended. Normal bowel sounds, without guarding, and without rebound.   Neurologic:  Alert and  oriented x4;  grossly normal neurologically.  Impression/Plan:     SCREENING  Plan:  1. TCS TODAY

## 2012-10-28 ENCOUNTER — Encounter (HOSPITAL_COMMUNITY): Payer: Self-pay | Admitting: Gastroenterology

## 2012-11-03 ENCOUNTER — Telehealth: Payer: Self-pay | Admitting: Gastroenterology

## 2012-11-03 NOTE — Telephone Encounter (Signed)
Path faxed to PCP, recall made 

## 2012-11-03 NOTE — Telephone Encounter (Signed)
Please call pt. She had HYPERPLASTIC POLYPS removed from her colon. TCS in 10 years. High fiber diet.  

## 2012-11-03 NOTE — Telephone Encounter (Signed)
LMOM to call back

## 2012-11-04 NOTE — Telephone Encounter (Signed)
LMOM to call.

## 2012-11-09 NOTE — Telephone Encounter (Signed)
Pt called and was informed of results.  

## 2012-11-30 ENCOUNTER — Telehealth: Payer: Self-pay | Admitting: Urgent Care

## 2012-11-30 NOTE — Telephone Encounter (Signed)
Please call to see remind pt to have TSH drawn. Thanks

## 2012-11-30 NOTE — Telephone Encounter (Signed)
LMOM to get labs and to let me know that she got the message.

## 2012-12-07 NOTE — Telephone Encounter (Signed)
Left another message for pt to go get labs drawn and mailing a letter to remind also.

## 2012-12-17 NOTE — Progress Notes (Signed)
REVIEWED.  PT NEEDS OPV IN 1-2 MOS TO ASSESS CONSTIPATION

## 2012-12-17 NOTE — Progress Notes (Signed)
TCS NOV 2013: HYPERPLASTIC POLYPS

## 2012-12-21 ENCOUNTER — Encounter: Payer: Self-pay | Admitting: Gastroenterology

## 2012-12-21 NOTE — Progress Notes (Signed)
Pt is aware of OV and appt card was mailed °

## 2013-01-27 ENCOUNTER — Encounter: Payer: Self-pay | Admitting: Gastroenterology

## 2013-01-28 ENCOUNTER — Ambulatory Visit: Payer: PRIVATE HEALTH INSURANCE | Admitting: Gastroenterology

## 2013-03-08 ENCOUNTER — Other Ambulatory Visit (HOSPITAL_COMMUNITY)
Admission: RE | Admit: 2013-03-08 | Discharge: 2013-03-08 | Disposition: A | Discharge status: Home / Self Care | Payer: BC Managed Care – PPO | Source: Ambulatory Visit | Attending: Family Medicine | Admitting: Family Medicine

## 2013-03-08 ENCOUNTER — Other Ambulatory Visit: Payer: Self-pay | Admitting: Family Medicine

## 2013-03-08 DIAGNOSIS — Z1151 Encounter for screening for human papillomavirus (HPV): Secondary | ICD-10-CM | POA: Insufficient documentation

## 2013-03-08 DIAGNOSIS — Z01419 Encounter for gynecological examination (general) (routine) without abnormal findings: Secondary | ICD-10-CM | POA: Insufficient documentation

## 2013-03-10 ENCOUNTER — Encounter: Payer: Self-pay | Admitting: Gastroenterology

## 2013-03-10 ENCOUNTER — Ambulatory Visit (INDEPENDENT_AMBULATORY_CARE_PROVIDER_SITE_OTHER): Payer: BC Managed Care – PPO | Admitting: Gastroenterology

## 2013-03-10 VITALS — BP 111/69 | HR 63 | Temp 98.2°F | Ht 66.0 in | Wt 165.0 lb

## 2013-03-10 DIAGNOSIS — K5909 Other constipation: Secondary | ICD-10-CM

## 2013-03-10 DIAGNOSIS — K5900 Constipation, unspecified: Secondary | ICD-10-CM

## 2013-03-10 NOTE — Progress Notes (Signed)
  Subjective:    Patient ID: Juliene Pina, female    DOB: 11-23-1956, 57 y.o.   MRN: [id number]  PCP: H[facility]  HPI LINZESS GAVE HER HEART PALPITATIONS AND LOOSE SOFT STOOLS. TAKING MIRALAX ONCE DAILY AND HAVING A BM ONCE A DAY( 5 OR 6). WOULD LIKE A 3 OR 4. PT DENIES FEVER, CHILLS, BRBPR, nausea, vomiting, melena, diarrhea,  abd pain, problems swallowing, problems with sedation, heartburn or indigestion.  Past Medical History  Diagnosis Date  . Chronic constipation     Past Surgical History  Procedure Laterality Date  . Colonoscopy  12/05/2003    Rehman-Small external hemorrhoids, otherwise, normal but redundant colon with mild melanosis coli  . Anterior cervical diskectomy      with fusion of C6 to C7  . Partial hysterectomy    . Cataract extraction, bilateral    . Abdominal hysterectomy    . Right finger surgery    . Colonoscopy  10/26/2012    [id number] polyps measuring 2-4 mm in size were found in the ascending colon and rectum/ Small internal hemorrhoids   Allergies  Allergen Reactions  . Ceftriaxone Sodium Swelling  . Linzess (Linaclotide)     HEART PALPITATIONS   Current Outpatient Prescriptions  Medication Sig Dispense Refill  . aspirin 81 MG tablet Take 81 mg by mouth daily.      Marland Kitchen BIOTIN PO Take 1 tablet by mouth daily.       . fish oil-omega-3 fatty acids 1000 MG capsule Take 1 g by mouth daily.       .          Review of Systems     Objective:   Physical Exam  Vitals reviewed. Constitutional: She is oriented to person, place, and time. She appears well-nourished.  HENT:  Head: Normocephalic and atraumatic.  Eyes: Pupils are equal, round, and reactive to light. No scleral icterus.  Cardiovascular: Normal rate, regular rhythm and normal heart sounds.   Pulmonary/Chest: Effort normal and breath sounds normal. No respiratory distress.  Abdominal: Soft. Bowel sounds are normal.  Musculoskeletal: She exhibits no edema.  Neurological: She is alert and  oriented to person, place, and time.  NO FOCAL DEFICITS           Assessment & Plan:

## 2013-03-10 NOTE — Patient Instructions (Addendum)
CONTINUE MIRALAX 1/2 DOSE DAILY. USE AS NEEDED IF YOU HAVE LOOSE STOOLS. ADD PROBIOTIC DAILY (ALIGN, [employer]OR[employer], [employer], [employer]AND)  DRINK WATER EAT FIBER FOLLOW UP ONCE A YEAR.  High-Fiber Diet A high-fiber diet changes your normal diet to include more whole grains, legumes, fruits, and vegetables. Changes in the diet involve replacing refined carbohydrates with unrefined foods. The calorie level of the diet is essentially unchanged. The Dietary Reference Intake (recommended amount) for adult males is 38 grams per day. For adult females, it is 25 grams per day. Pregnant and lactating women should consume 28 grams of fiber per day. Fiber is the intact part of a plant that is not broken down during digestion. Functional fiber is fiber that has been isolated from the plant to provide a beneficial effect in the body. PURPOSE  Increase stool bulk.   Ease and regulate bowel movements.   Lower cholesterol.  INDICATIONS THAT YOU NEED MORE FIBER  Constipation and hemorrhoids.   Uncomplicated diverticulosis (intestine condition) and irritable bowel syndrome.   Weight management.   As a protective measure against hardening of the arteries (atherosclerosis), diabetes, and cancer.   GUIDELINES FOR INCREASING FIBER IN THE DIET  Start adding fiber to the diet slowly. A gradual increase of about 5 more grams (2 slices of whole-wheat bread, 2 servings of most fruits or vegetables, or 1 bowl of high-fiber cereal) per day is best. Too rapid an increase in fiber may result in constipation, flatulence, and bloating.   Drink enough water and fluids to keep your urine clear or pale yellow. Water, juice, or caffeine-free drinks are recommended. Not drinking enough fluid may cause constipation.   Eat a variety of high-fiber foods rather than one type of fiber.   Try to increase your intake of fiber through using high-fiber foods rather than fiber pills or supplements that contain small  amounts of fiber.   The goal is to change the types of food eaten. Do not supplement your present diet with high-fiber foods, but replace foods in your present diet.  INCLUDE A VARIETY OF FIBER SOURCES  Replace refined and processed grains with whole grains, canned fruits with fresh fruits, and incorporate other fiber sources. White rice, white breads, and most bakery goods contain little or no fiber.   Brown whole-grain rice, buckwheat oats, and many fruits and vegetables are all good sources of fiber. These include: broccoli, Brussels sprouts, cabbage, cauliflower, beets, sweet potatoes, white potatoes (skin on), carrots, tomatoes, eggplant, squash, berries, fresh fruits, and dried fruits.   Cereals appear to be the richest source of fiber. Cereal fiber is found in whole grains and bran. Bran is the fiber-rich outer coat of cereal grain, which is largely removed in refining. In whole-grain cereals, the bran remains. In breakfast cereals, the largest amount of fiber is found in those with "bran" in their names. The fiber content is sometimes indicated on the label.   You may need to include additional fruits and vegetables each day.   In baking, for 1 cup white flour, you may use the following substitutions:   1 cup whole-wheat flour minus 2 tablespoons.   1/2 cup white flour plus 1/2 cup whole-wheat flour.

## 2013-03-10 NOTE — Assessment & Plan Note (Addendum)
IDIOPATHIC-CAN'T TOLERATE LINZESS.  CONTINUE MIRALX 1/2 DOSE DAILY. ADD PROBIOTIC DAILY AWAIT TSH FROM DR. HILL'S [facility]. OPV IN 1 YEAR.

## 2013-03-10 NOTE — Progress Notes (Signed)
CC PCP 

## 2013-03-16 NOTE — Progress Notes (Signed)
REMINDR MADE

## 2013-12-29 ENCOUNTER — Other Ambulatory Visit (HOSPITAL_COMMUNITY): Payer: Self-pay | Admitting: Family Medicine

## 2013-12-29 DIAGNOSIS — Z139 Encounter for screening, unspecified: Secondary | ICD-10-CM

## 2014-01-03 ENCOUNTER — Ambulatory Visit (HOSPITAL_COMMUNITY)
Admission: RE | Admit: 2014-01-03 | Discharge: 2014-01-03 | Disposition: A | Discharge status: Home / Self Care | Payer: BC Managed Care – PPO | Source: Ambulatory Visit | Attending: Family Medicine | Admitting: Family Medicine

## 2014-01-03 DIAGNOSIS — Z1231 Encounter for screening mammogram for malignant neoplasm of breast: Secondary | ICD-10-CM | POA: Insufficient documentation

## 2014-01-03 DIAGNOSIS — Z139 Encounter for screening, unspecified: Secondary | ICD-10-CM

## 2014-08-08 ENCOUNTER — Ambulatory Visit (HOSPITAL_COMMUNITY)
Admission: RE | Admit: 2014-08-08 | Discharge: 2014-08-08 | Disposition: A | Discharge status: Home / Self Care | Payer: BC Managed Care – PPO | Source: Ambulatory Visit | Attending: Family Medicine | Admitting: Family Medicine

## 2014-08-08 ENCOUNTER — Other Ambulatory Visit (HOSPITAL_COMMUNITY): Payer: Self-pay | Admitting: Family Medicine

## 2014-08-08 DIAGNOSIS — M25469 Effusion, unspecified knee: Secondary | ICD-10-CM | POA: Diagnosis not present

## 2014-08-08 DIAGNOSIS — M7989 Other specified soft tissue disorders: Principal | ICD-10-CM

## 2014-08-08 DIAGNOSIS — R609 Edema, unspecified: Secondary | ICD-10-CM

## 2014-08-08 DIAGNOSIS — M79662 Pain in left lower leg: Secondary | ICD-10-CM

## 2014-08-08 DIAGNOSIS — M25569 Pain in unspecified knee: Secondary | ICD-10-CM | POA: Insufficient documentation

## 2014-08-08 DIAGNOSIS — M25562 Pain in left knee: Secondary | ICD-10-CM

## 2015-03-07 ENCOUNTER — Other Ambulatory Visit (HOSPITAL_COMMUNITY): Payer: Self-pay | Admitting: Family Medicine

## 2015-03-07 DIAGNOSIS — Z1231 Encounter for screening mammogram for malignant neoplasm of breast: Secondary | ICD-10-CM

## 2015-03-20 ENCOUNTER — Ambulatory Visit (HOSPITAL_COMMUNITY)
Admission: RE | Admit: 2015-03-20 | Discharge: 2015-03-20 | Disposition: A | Discharge status: Home / Self Care | Payer: BLUE CROSS/BLUE SHIELD | Source: Ambulatory Visit | Attending: Family Medicine | Admitting: Family Medicine

## 2015-03-20 DIAGNOSIS — Z1231 Encounter for screening mammogram for malignant neoplasm of breast: Secondary | ICD-10-CM | POA: Diagnosis present

## 2015-11-08 ENCOUNTER — Ambulatory Visit (INDEPENDENT_AMBULATORY_CARE_PROVIDER_SITE_OTHER): Payer: BLUE CROSS/BLUE SHIELD | Admitting: Gastroenterology

## 2015-11-08 ENCOUNTER — Encounter: Payer: Self-pay | Admitting: Gastroenterology

## 2015-11-08 VITALS — BP 129/82 | HR 70 | Temp 98.2°F | Ht 65.0 in | Wt 180.2 lb

## 2015-11-08 DIAGNOSIS — Z1211 Encounter for screening for malignant neoplasm of colon: Secondary | ICD-10-CM | POA: Insufficient documentation

## 2015-11-08 DIAGNOSIS — K5909 Other constipation: Secondary | ICD-10-CM

## 2015-11-08 DIAGNOSIS — K5900 Constipation, unspecified: Secondary | ICD-10-CM

## 2015-11-08 NOTE — Assessment & Plan Note (Signed)
NEXT COLONOSCOPY SHOULD BE IN 2023.

## 2015-11-08 NOTE — Progress Notes (Signed)
   Subjective:    Patient ID: Samantha Clarke, female    DOB: Apr 26, 1956, 59 y.o.   MRN: [id number]  Maggie Font, MD  HPI Started taking Chia seeds to help with BMs. 1 tbsp  Every night. BM: 1X USU AND MAYBE 2X/DAY. HAD ONE CHILD: LABOR 1-2.5 HRS AND HAD A TEAR. DRINKING WATER: YES. EATING FIBER IN ADDITION TO SEEDS.  PT DENIES FEVER, CHILLS, HEMATOCHEZIA, nausea, vomiting, melena, diarrhea, CHEST PAIN, SHORTNESS OF BREATH, CHANGE IN BOWEL IN HABITS,  abdominal pain, problems swallowing, OR heartburn or indigestion.  Past Medical History  Diagnosis Date  . Chronic constipation    Past Surgical History  Procedure Laterality Date  . Colonoscopy  12/05/2003    Rehman-Small external hemorrhoids, otherwise, normal but redundant colon with mild melanosis coli  . Anterior cervical diskectomy      with fusion of C6 to C7  . Partial hysterectomy    . Cataract extraction, bilateral    . Abdominal hysterectomy    . Right finger surgery    . Colonoscopy  10/26/2012    [id number] polyps measuring 2-4 mm in size were found in the ascending colon and rectum/ Small internal hemorrhoids   Allergies  Allergen Reactions  . Ceftriaxone Sodium Swelling  . Linzess [Linaclotide]     HEART PALPITATIONS   Current Outpatient Prescriptions  Medication Sig Dispense Refill  . Glucosamine 500 MG CAPS Take 500 mg by mouth 2 (two) times daily.    . simvastatin (ZOCOR) 10 MG tablet Take 10 mg by mouth daily.     Marland Kitchen aspirin 81 MG tablet Take 81 mg by mouth daily.    . fish oil-omega-3 fatty acids 1000 MG capsule Take 1 g by mouth daily.      Review of Systems PER HPI OTHERWISE ALL SYSTEMS ARE NEGATIVE.    Objective:   Physical Exam  Constitutional: She is oriented to person, place, and time. She appears well-developed and well-nourished. No distress.  HENT:  Head: Normocephalic and atraumatic.  Mouth/Throat: Oropharynx is clear and moist. No oropharyngeal exudate.  Eyes: Pupils are equal, round, and  reactive to light. No scleral icterus.  Neck: Normal range of motion. Neck supple.  Cardiovascular: Normal rate, regular rhythm and normal heart sounds.   Pulmonary/Chest: Effort normal and breath sounds normal. No respiratory distress.  Abdominal: Soft. Bowel sounds are normal. She exhibits no distension. There is no tenderness.  Musculoskeletal: She exhibits no edema.  Lymphadenopathy:    She has no cervical adenopathy.  Neurological: She is alert and oriented to person, place, and time.  NO FOCAL DEFICITS  Psychiatric: She has a normal mood and affect.  Vitals reviewed.     Assessment & Plan:

## 2015-11-08 NOTE — Assessment & Plan Note (Signed)
SYMPTOMS NOT IDEALLY CONTROLLED MOST LIKELY DUE TO PELVIC FLOOR DYSFUNCTION   CONTINUE TO DRINK WATER. FOLLOW A HIGH FIBER DIET. CONTINUE CHIA SEEDS AT BEDTIME. ADD IBGARD 2 DAILY WITH 8 OZ OF WATER IN THE MORNING. FOLLOW UP IN 6 MOS TO ONE YEAR.  GREATER THAN 50% WAS SPENT IN COUNSELING & COORDINATION OF CARE WITH THE PATIENT: DISCUSSED DIFFERENTIAL DIAGNOSIS, PROCEDURE, BENEFITS, ETIOLOGY, AND MANAGEMENT OF CONSTIPATION/PELVIC FLOOR DYSFUNCTION. TOTAL ENCOUNTER TIME: 15 MINS.

## 2015-11-08 NOTE — Patient Instructions (Signed)
YOU HAVE DIFFICULTY PASSING STOOL MOST LIKELY DUE TO PELVIC FLOOR DYSFUNCTION WHICH COMES FROM CHILD BIRTH AND AGE.  CONTINUE TO DRINK WATER.  FOLLOW A HIGH FIBER DIET.  CONTINUE CHIA SEEDS AT BEDTIME.  TAKE IBGARD 2 DAILY WITH 8 OZ OF WATER IN THE MORNING.  FOLLOW UP IN 6 MOS TO ONE YEAR.  YOUR NEXT COLONOSCOPY SHOULD BE IN 2023.

## 2015-11-09 NOTE — Progress Notes (Signed)
CC'D TO PCP °

## 2015-11-09 NOTE — Progress Notes (Signed)
ON RECALL  °

## 2016-03-25 ENCOUNTER — Encounter: Payer: Self-pay | Admitting: Gastroenterology

## 2016-06-05 DIAGNOSIS — E785 Hyperlipidemia, unspecified: Secondary | ICD-10-CM | POA: Diagnosis not present

## 2016-06-05 DIAGNOSIS — G4762 Sleep related leg cramps: Secondary | ICD-10-CM | POA: Diagnosis not present

## 2016-06-05 DIAGNOSIS — M791 Myalgia: Secondary | ICD-10-CM | POA: Diagnosis not present

## 2016-08-01 DIAGNOSIS — G4762 Sleep related leg cramps: Secondary | ICD-10-CM | POA: Diagnosis not present

## 2016-08-01 DIAGNOSIS — E785 Hyperlipidemia, unspecified: Secondary | ICD-10-CM | POA: Diagnosis not present

## 2016-08-05 DIAGNOSIS — E663 Overweight: Secondary | ICD-10-CM | POA: Diagnosis not present

## 2016-08-05 DIAGNOSIS — G4762 Sleep related leg cramps: Secondary | ICD-10-CM | POA: Diagnosis not present

## 2016-08-05 DIAGNOSIS — E785 Hyperlipidemia, unspecified: Secondary | ICD-10-CM | POA: Diagnosis not present

## 2016-09-04 DIAGNOSIS — J069 Acute upper respiratory infection, unspecified: Secondary | ICD-10-CM | POA: Diagnosis not present

## 2016-10-08 DIAGNOSIS — E785 Hyperlipidemia, unspecified: Secondary | ICD-10-CM | POA: Diagnosis not present

## 2016-12-05 ENCOUNTER — Encounter (HOSPITAL_COMMUNITY): Payer: Self-pay | Admitting: *Deleted

## 2016-12-05 ENCOUNTER — Emergency Department (HOSPITAL_COMMUNITY)
Admission: EM | Admit: 2016-12-05 | Discharge: 2016-12-05 | Disposition: A | Discharge status: Home / Self Care | Payer: BLUE CROSS/BLUE SHIELD | Attending: Emergency Medicine | Admitting: Emergency Medicine

## 2016-12-05 DIAGNOSIS — I889 Nonspecific lymphadenitis, unspecified: Secondary | ICD-10-CM | POA: Diagnosis not present

## 2016-12-05 DIAGNOSIS — M542 Cervicalgia: Secondary | ICD-10-CM | POA: Diagnosis present

## 2016-12-05 DIAGNOSIS — Z7982 Long term (current) use of aspirin: Secondary | ICD-10-CM | POA: Insufficient documentation

## 2016-12-05 DIAGNOSIS — I888 Other nonspecific lymphadenitis: Secondary | ICD-10-CM | POA: Insufficient documentation

## 2016-12-05 HISTORY — DX: Hyperlipidemia, unspecified: E78.5

## 2016-12-05 MED ORDER — DOXYCYCLINE HYCLATE 100 MG PO CAPS: 100.0000 mg | ORAL_CAPSULE | Freq: Two times a day (BID) | ORAL | 0 refills | Status: DC

## 2016-12-05 NOTE — ED Triage Notes (Signed)
Pt comes in with right sided neck pain starting yesterday morning when she woke up. Pt denies any injury. Pt states she feels like there are knots under her skin. NAD noted

## 2016-12-07 NOTE — ED Provider Notes (Signed)
[place] DEPT Provider Note   CSN: [id number] Arrival date & time: 12/05/16  1712     History   Chief Complaint Chief Complaint  Patient presents with  . Neck Pain    HPI Samantha Clarke is a 60 y.o. female presenting with a painful swollen knot on her right posterior neck which she woke with yesterday morning.  She denies fevers, chills, recent uri or sore throat pain and has had no injuries, rash or swelling to her head, scalp or neck.  she does endorse coloring her hair about 2 weeks ago, but has used this same product in the past.  She has had no treatment prior to arrival.  She denies any other areas of swelling or pain.  The history is provided by the patient.    Past Medical History:  Diagnosis Date  . Chronic constipation   . Hyperlipidemia     Patient Active Problem List   Diagnosis Date Noted  . Colon cancer screening 11/08/2015  . Chronic constipation 10/06/2012  . SHOULDER PAIN 11/28/2008  . IMPINGEMENT SYNDROME 11/28/2008    Past Surgical History:  Procedure Laterality Date  . ABDOMINAL HYSTERECTOMY    . Anterior cervical diskectomy     with fusion of C6 to C7  . CATARACT EXTRACTION, BILATERAL    . COLONOSCOPY  12/05/2003   Rehman-Small external hemorrhoids, otherwise, normal but redundant colon with mild melanosis coli  . COLONOSCOPY  10/26/2012   [id number] polyps measuring 2-4 mm in size were found in the ascending colon and rectum/ Small internal hemorrhoids  . PARTIAL HYSTERECTOMY    . right finger surgery      OB History    No data available       Home Medications    Prior to Admission medications   Medication Sig Start Date End Date Taking? Authorizing Provider  aspirin 81 MG tablet Take 81 mg by mouth daily.    Historical Provider, MD  doxycycline (VIBRAMYCIN) 100 MG capsule Take 1 capsule (100 mg total) by mouth 2 (two) times daily. 12/05/16   Evalee Jefferson, PA-C  fish oil-omega-3 fatty acids 1000 MG capsule Take 1 g by mouth  daily.     Historical Provider, MD  Glucosamine 500 MG CAPS Take 500 mg by mouth 2 (two) times daily.    Historical Provider, MD  simvastatin (ZOCOR) 10 MG tablet Take 10 mg by mouth daily. 10/12/15   Historical Provider, MD    Family History Family History  Problem Relation Age of Onset  . Heart failure Mother   . Stroke Father   . Colon cancer Neg Hx     Social History Social History  Substance Use Topics  . Smoking status: Never Smoker  . Smokeless tobacco: Never Used  . Alcohol use No     Allergies   Ceftriaxone sodium and Linzess [linaclotide]   Review of Systems Review of Systems  Constitutional: Negative for fever.  HENT: Negative for congestion and sore throat.   Eyes: Negative.   Respiratory: Negative.   Gastrointestinal: Negative for abdominal pain and nausea.  Genitourinary: Negative.   Musculoskeletal: Negative for arthralgias, joint swelling and neck pain.  Skin: Negative.  Negative for color change, rash and wound.  Neurological: Negative for dizziness, weakness, light-headedness, numbness and headaches.  Psychiatric/Behavioral: Negative.      Physical Exam Updated Vital Signs BP 157/85 (BP Location: Left Arm)   Pulse 62   Temp 98.7 F (37.1 C) (Oral)   Resp 20  Ht 5\' 5"  (1.651 m)   Wt 81.6 kg   SpO2 100%   BMI 29.95 kg/m   Physical Exam  Constitutional: She appears well-developed and well-nourished.  HENT:  Head: Normocephalic and atraumatic.  Right Ear: Tympanic membrane and ear canal normal.  Left Ear: Tympanic membrane and ear canal normal.  Nose: Nose normal.  Mouth/Throat: Uvula is midline and oropharynx is clear and moist. No uvula swelling.  Eyes: Conjunctivae are normal.  Neck: Normal range of motion. Neck supple. No muscular tenderness present. No neck rigidity. No edema and no erythema present.  Cardiovascular: Normal rate, regular rhythm, normal heart sounds and intact distal pulses.   Pulmonary/Chest: Effort normal and breath  sounds normal. She has no wheezes.  Abdominal: Soft. Bowel sounds are normal. There is no tenderness.  Musculoskeletal: Normal range of motion.  Lymphadenopathy:       Head (right side): Occipital adenopathy present.    She has no cervical adenopathy.    She has no axillary adenopathy.       Right: No inguinal adenopathy present.       Left: No inguinal adenopathy present.  Neurological: She is alert.  Skin: Skin is warm and dry.  Psychiatric: She has a normal mood and affect.  Nursing note and vitals reviewed.    ED Treatments / Results  Labs (all labs ordered are listed, but only abnormal results are displayed) Labs Reviewed - No data to display  EKG  EKG Interpretation None       Radiology No results found.  Procedures Procedures (including critical care time)  Medications Ordered in ED Medications - No data to display   Initial Impression / Assessment and Plan / ED Course  I have reviewed the triage vital signs and the nursing notes.  Pertinent labs & imaging results that were available during my care of the patient were reviewed by me and considered in my medical decision making (see chart for details).  Clinical Course     Right occipital adenopathy without clear cause.  No evidence for head or neck infection. No other enlarged lymph nodes found.  She was advised warm compresses, avoid rubbing, doxycycline.  Advised recheck by pcp if sx persist after this tx.   The patient appears reasonably screened and/or stabilized for discharge and I doubt any other medical condition or other [facility] requiring further screening, evaluation, or treatment in the ED at this time prior to discharge.   Final Clinical Impressions(s) / ED Diagnoses   Final diagnoses:  Occipital lymphadenitis    New Prescriptions Discharge Medication List as of 12/05/2016  7:41 PM    START taking these medications   Details  doxycycline (VIBRAMYCIN) 100 MG capsule Take 1 capsule (100 mg  total) by mouth 2 (two) times daily., Starting Thu 12/05/2016, Print         Evalee Jefferson, PA-C 12/07/16 1331    Courtney Paris, MD 12/07/16 2230

## 2017-04-08 DIAGNOSIS — E785 Hyperlipidemia, unspecified: Secondary | ICD-10-CM | POA: Diagnosis not present

## 2017-05-14 DIAGNOSIS — E785 Hyperlipidemia, unspecified: Secondary | ICD-10-CM | POA: Diagnosis not present

## 2017-05-14 DIAGNOSIS — R05 Cough: Secondary | ICD-10-CM | POA: Diagnosis not present

## 2017-07-09 DIAGNOSIS — Z6829 Body mass index (BMI) 29.0-29.9, adult: Secondary | ICD-10-CM | POA: Diagnosis not present

## 2017-07-09 DIAGNOSIS — E785 Hyperlipidemia, unspecified: Secondary | ICD-10-CM | POA: Diagnosis not present

## 2017-07-09 DIAGNOSIS — I10 Essential (primary) hypertension: Secondary | ICD-10-CM | POA: Diagnosis not present

## 2017-11-11 DIAGNOSIS — G4762 Sleep related leg cramps: Secondary | ICD-10-CM | POA: Diagnosis not present

## 2017-11-11 DIAGNOSIS — E785 Hyperlipidemia, unspecified: Secondary | ICD-10-CM | POA: Diagnosis not present

## 2017-11-26 DIAGNOSIS — M79671 Pain in right foot: Secondary | ICD-10-CM | POA: Diagnosis not present

## 2017-11-26 DIAGNOSIS — E785 Hyperlipidemia, unspecified: Secondary | ICD-10-CM | POA: Diagnosis not present

## 2017-11-26 DIAGNOSIS — I10 Essential (primary) hypertension: Secondary | ICD-10-CM | POA: Diagnosis not present

## 2017-12-16 ENCOUNTER — Other Ambulatory Visit (HOSPITAL_COMMUNITY): Payer: Self-pay | Admitting: Family Medicine

## 2017-12-16 DIAGNOSIS — Z1231 Encounter for screening mammogram for malignant neoplasm of breast: Secondary | ICD-10-CM

## 2018-01-05 ENCOUNTER — Ambulatory Visit (HOSPITAL_COMMUNITY)
Admission: RE | Admit: 2018-01-05 | Discharge: 2018-01-05 | Disposition: A | Discharge status: Home / Self Care | Payer: BLUE CROSS/BLUE SHIELD | Source: Ambulatory Visit | Attending: Family Medicine | Admitting: Family Medicine

## 2018-01-05 DIAGNOSIS — Z1231 Encounter for screening mammogram for malignant neoplasm of breast: Secondary | ICD-10-CM | POA: Diagnosis not present

## 2018-03-11 DIAGNOSIS — E785 Hyperlipidemia, unspecified: Secondary | ICD-10-CM | POA: Diagnosis not present

## 2018-04-15 DIAGNOSIS — I10 Essential (primary) hypertension: Secondary | ICD-10-CM | POA: Diagnosis not present

## 2018-04-15 DIAGNOSIS — E785 Hyperlipidemia, unspecified: Secondary | ICD-10-CM | POA: Diagnosis not present

## 2018-05-19 DIAGNOSIS — E785 Hyperlipidemia, unspecified: Secondary | ICD-10-CM | POA: Diagnosis not present

## 2018-05-19 DIAGNOSIS — M25519 Pain in unspecified shoulder: Secondary | ICD-10-CM | POA: Diagnosis not present

## 2018-08-29 DIAGNOSIS — Z6830 Body mass index (BMI) 30.0-30.9, adult: Secondary | ICD-10-CM | POA: Diagnosis not present

## 2018-08-29 DIAGNOSIS — E785 Hyperlipidemia, unspecified: Secondary | ICD-10-CM | POA: Diagnosis not present

## 2018-08-29 DIAGNOSIS — I10 Essential (primary) hypertension: Secondary | ICD-10-CM | POA: Diagnosis not present

## 2018-11-28 DIAGNOSIS — E785 Hyperlipidemia, unspecified: Secondary | ICD-10-CM | POA: Diagnosis not present

## 2018-11-28 DIAGNOSIS — M79671 Pain in right foot: Secondary | ICD-10-CM | POA: Diagnosis not present

## 2018-11-28 DIAGNOSIS — I10 Essential (primary) hypertension: Secondary | ICD-10-CM | POA: Diagnosis not present

## 2019-04-10 DIAGNOSIS — E785 Hyperlipidemia, unspecified: Secondary | ICD-10-CM | POA: Diagnosis not present

## 2019-04-10 DIAGNOSIS — I10 Essential (primary) hypertension: Secondary | ICD-10-CM | POA: Diagnosis not present

## 2019-04-10 DIAGNOSIS — Z7189 Other specified counseling: Secondary | ICD-10-CM | POA: Diagnosis not present

## 2019-07-23 DIAGNOSIS — Z1231 Encounter for screening mammogram for malignant neoplasm of breast: Secondary | ICD-10-CM | POA: Diagnosis not present

## 2019-08-10 DIAGNOSIS — E781 Pure hyperglyceridemia: Secondary | ICD-10-CM | POA: Diagnosis not present

## 2019-08-10 DIAGNOSIS — E785 Hyperlipidemia, unspecified: Secondary | ICD-10-CM | POA: Diagnosis not present

## 2019-11-09 DIAGNOSIS — Z7189 Other specified counseling: Secondary | ICD-10-CM | POA: Diagnosis not present

## 2019-11-09 DIAGNOSIS — E785 Hyperlipidemia, unspecified: Secondary | ICD-10-CM | POA: Diagnosis not present

## 2019-11-09 DIAGNOSIS — E782 Mixed hyperlipidemia: Secondary | ICD-10-CM | POA: Diagnosis not present

## 2019-11-10 ENCOUNTER — Other Ambulatory Visit: Payer: Self-pay

## 2019-11-10 DIAGNOSIS — Z20822 Contact with and (suspected) exposure to covid-19: Secondary | ICD-10-CM

## 2019-11-13 LAB — NOVEL CORONAVIRUS, NAA: SARS-CoV-2, NAA: NOT DETECTED

## 2019-12-16 DIAGNOSIS — H524 Presbyopia: Secondary | ICD-10-CM | POA: Diagnosis not present

## 2019-12-16 DIAGNOSIS — H2513 Age-related nuclear cataract, bilateral: Secondary | ICD-10-CM | POA: Diagnosis not present

## 2020-02-07 DIAGNOSIS — E789 Disorder of lipoprotein metabolism, unspecified: Secondary | ICD-10-CM | POA: Diagnosis not present

## 2020-02-07 DIAGNOSIS — E785 Hyperlipidemia, unspecified: Secondary | ICD-10-CM | POA: Diagnosis not present

## 2020-06-12 DIAGNOSIS — E781 Pure hyperglyceridemia: Secondary | ICD-10-CM | POA: Diagnosis not present

## 2020-06-12 DIAGNOSIS — E785 Hyperlipidemia, unspecified: Secondary | ICD-10-CM | POA: Diagnosis not present

## 2020-06-12 DIAGNOSIS — I10 Essential (primary) hypertension: Secondary | ICD-10-CM | POA: Diagnosis not present

## 2020-07-31 DIAGNOSIS — Z1231 Encounter for screening mammogram for malignant neoplasm of breast: Secondary | ICD-10-CM | POA: Diagnosis not present

## 2020-10-17 DIAGNOSIS — R059 Cough, unspecified: Secondary | ICD-10-CM | POA: Diagnosis not present

## 2020-10-17 DIAGNOSIS — E785 Hyperlipidemia, unspecified: Secondary | ICD-10-CM | POA: Diagnosis not present

## 2020-10-17 DIAGNOSIS — I10 Essential (primary) hypertension: Secondary | ICD-10-CM | POA: Diagnosis not present

## 2020-10-17 DIAGNOSIS — K5900 Constipation, unspecified: Secondary | ICD-10-CM | POA: Diagnosis not present

## 2021-02-05 DIAGNOSIS — I10 Essential (primary) hypertension: Secondary | ICD-10-CM | POA: Diagnosis not present

## 2021-02-05 DIAGNOSIS — E785 Hyperlipidemia, unspecified: Secondary | ICD-10-CM | POA: Diagnosis not present

## 2021-02-20 DIAGNOSIS — K5900 Constipation, unspecified: Secondary | ICD-10-CM | POA: Diagnosis not present

## 2021-02-20 DIAGNOSIS — I10 Essential (primary) hypertension: Secondary | ICD-10-CM | POA: Diagnosis not present

## 2021-02-20 DIAGNOSIS — E781 Pure hyperglyceridemia: Secondary | ICD-10-CM | POA: Diagnosis not present

## 2021-04-07 DIAGNOSIS — I10 Essential (primary) hypertension: Secondary | ICD-10-CM | POA: Diagnosis not present

## 2021-04-07 DIAGNOSIS — E785 Hyperlipidemia, unspecified: Secondary | ICD-10-CM | POA: Diagnosis not present

## 2021-06-26 DIAGNOSIS — I10 Essential (primary) hypertension: Secondary | ICD-10-CM | POA: Diagnosis not present

## 2021-06-26 DIAGNOSIS — M25511 Pain in right shoulder: Secondary | ICD-10-CM | POA: Diagnosis not present

## 2021-06-26 DIAGNOSIS — R21 Rash and other nonspecific skin eruption: Secondary | ICD-10-CM | POA: Diagnosis not present

## 2021-06-26 DIAGNOSIS — M25512 Pain in left shoulder: Secondary | ICD-10-CM | POA: Diagnosis not present

## 2021-06-26 DIAGNOSIS — E785 Hyperlipidemia, unspecified: Secondary | ICD-10-CM | POA: Diagnosis not present

## 2021-07-08 DIAGNOSIS — I10 Essential (primary) hypertension: Secondary | ICD-10-CM | POA: Diagnosis not present

## 2021-07-08 DIAGNOSIS — E785 Hyperlipidemia, unspecified: Secondary | ICD-10-CM | POA: Diagnosis not present

## 2021-08-17 DIAGNOSIS — Z1231 Encounter for screening mammogram for malignant neoplasm of breast: Secondary | ICD-10-CM | POA: Diagnosis not present

## 2021-08-28 DIAGNOSIS — N6321 Unspecified lump in the left breast, upper outer quadrant: Secondary | ICD-10-CM | POA: Diagnosis not present

## 2021-09-05 DIAGNOSIS — H2513 Age-related nuclear cataract, bilateral: Secondary | ICD-10-CM | POA: Diagnosis not present

## 2021-09-05 DIAGNOSIS — H524 Presbyopia: Secondary | ICD-10-CM | POA: Diagnosis not present

## 2021-09-07 DIAGNOSIS — E785 Hyperlipidemia, unspecified: Secondary | ICD-10-CM | POA: Diagnosis not present

## 2021-09-07 DIAGNOSIS — I10 Essential (primary) hypertension: Secondary | ICD-10-CM | POA: Diagnosis not present

## 2021-10-08 DIAGNOSIS — E785 Hyperlipidemia, unspecified: Secondary | ICD-10-CM | POA: Diagnosis not present

## 2021-10-08 DIAGNOSIS — I10 Essential (primary) hypertension: Secondary | ICD-10-CM | POA: Diagnosis not present

## 2021-10-30 DIAGNOSIS — Z0000 Encounter for general adult medical examination without abnormal findings: Secondary | ICD-10-CM | POA: Diagnosis not present

## 2021-10-30 DIAGNOSIS — E785 Hyperlipidemia, unspecified: Secondary | ICD-10-CM | POA: Diagnosis not present

## 2021-10-30 DIAGNOSIS — I10 Essential (primary) hypertension: Secondary | ICD-10-CM | POA: Diagnosis not present

## 2021-10-30 DIAGNOSIS — Z634 Disappearance and death of family member: Secondary | ICD-10-CM | POA: Diagnosis not present

## 2021-11-15 ENCOUNTER — Ambulatory Visit (INDEPENDENT_AMBULATORY_CARE_PROVIDER_SITE_OTHER): Payer: Medicare Other | Admitting: Obstetrics & Gynecology

## 2021-11-15 ENCOUNTER — Encounter: Payer: Self-pay | Admitting: Obstetrics & Gynecology

## 2021-11-15 ENCOUNTER — Other Ambulatory Visit: Payer: Self-pay

## 2021-11-15 VITALS — BP 144/88 | HR 57 | Ht 65.0 in | Wt 182.0 lb

## 2021-11-15 DIAGNOSIS — Z1211 Encounter for screening for malignant neoplasm of colon: Secondary | ICD-10-CM

## 2021-11-15 DIAGNOSIS — Z1231 Encounter for screening mammogram for malignant neoplasm of breast: Secondary | ICD-10-CM | POA: Diagnosis not present

## 2021-11-15 DIAGNOSIS — Z1212 Encounter for screening for malignant neoplasm of rectum: Secondary | ICD-10-CM

## 2021-11-15 DIAGNOSIS — Z01419 Encounter for gynecological examination (general) (routine) without abnormal findings: Secondary | ICD-10-CM | POA: Diagnosis not present

## 2021-11-15 LAB — HEMOCCULT GUIAC POC 1CARD (OFFICE): Fecal Occult Blood, POC: NEGATIVE

## 2021-11-15 NOTE — Addendum Note (Signed)
Addended by: Dorita Sciara, Dnasia Gauna A on: 11/15/2021 01:40 PM   Modules accepted: Orders

## 2021-11-15 NOTE — Progress Notes (Signed)
Subjective:     Samantha Clarke is a 65 y.o. female here for a routine exam.  No LMP recorded. Patient has had a hysterectomy. G1P1001 Birth Control Method:  hysterectomy Menstrual Calendar(currently): amenorrhea  Current complaints: none.   Current acute medical issues:  none   Recent Gynecologic History No LMP recorded. Patient has had a hysterectomy. Last Pap: unsure N/A,   Last mammogram: 2019,  normal  Past Medical History:  Diagnosis Date   Chronic constipation    Hyperlipidemia     Past Surgical History:  Procedure Laterality Date   ABDOMINAL HYSTERECTOMY     Anterior cervical diskectomy     with fusion of C6 to C7   CATARACT EXTRACTION, BILATERAL     COLONOSCOPY  12/05/2003   Rehman-Small external hemorrhoids, otherwise, normal but redundant colon with mild melanosis coli   COLONOSCOPY  10/26/2012   [id number] polyps measuring 2-4 mm in size were found in the ascending colon and rectum/ Small internal hemorrhoids   PARTIAL HYSTERECTOMY     right finger surgery      OB History     Gravida  1   Para  1   Term  1   Preterm      AB      Living  1      SAB      IAB      Ectopic      Multiple      Live Births  1           Social History   Socioeconomic History   Marital status: Divorced    Spouse name: Not on file   Number of children: 1   Years of education: Not on file   Highest education level: Not on file  Occupational History   Occupation: English as a second language teacher: [employer]  Tobacco Use   Smoking status: Never   Smokeless tobacco: Never  Vaping Use   Vaping Use: Never used  Substance and Sexual Activity   Alcohol use: Yes    Comment: occ   Drug use: No   Sexual activity: Yes    Birth control/protection: Surgical  Other Topics Concern   Not on file  Social History Narrative   Lives alone   Social Determinants of Health   Financial Resource Strain: Low Risk    Difficulty of Paying Living Expenses: Not hard at all  Food  Insecurity: No Food Insecurity   Worried About Charity fundraiser in the Last Year: Never true   Ran Out of Food in the Last Year: Never true  Transportation Needs: No Transportation Needs   Lack of Transportation (Medical): No   Lack of Transportation (Non-Medical): No  Physical Activity: Inactive   Days of Exercise per Week: 3 days   Minutes of Exercise per Session: 0 min  Stress: No Stress Concern Present   Feeling of Stress : Only a little  Social Connections: Moderately Integrated   Frequency of Communication with Friends and Family: More than three times a week   Frequency of Social Gatherings with Friends and Family: Twice a week   Attends Religious Services: More than 4 times per year   Active Member of [employer] or Organizations: Yes   Attends Archivist Meetings: 1 to 4 times per year   Marital Status: Divorced    Family History  Problem Relation Age of Onset   Stroke Father    Heart failure Mother    Bone cancer  Sister    Liver disease Sister    Stroke Sister    Colon cancer Neg Hx      Current Outpatient Medications:    acetaminophen (TYLENOL) 500 MG tablet, Take 500 mg by mouth every 6 (six) hours as needed., Disp: , Rfl:    amLODipine (NORVASC) 5 MG tablet, Take 5 mg by mouth daily., Disp: , Rfl:    aspirin 81 MG tablet, Take 81 mg by mouth daily., Disp: , Rfl:    fenofibrate (TRICOR) 48 MG tablet, Take 48 mg by mouth daily., Disp: , Rfl:    Multiple Vitamin (MULTIVITAMIN) tablet, Take 1 tablet by mouth daily., Disp: , Rfl:    fish oil-omega-3 fatty acids 1000 MG capsule, Take 1 g by mouth daily.  (Patient not taking: Reported on 11/15/2021), Disp: , Rfl:    Glucosamine 500 MG CAPS, Take 500 mg by mouth 2 (two) times daily. (Patient not taking: Reported on 11/15/2021), Disp: , Rfl:   Review of Systems  Review of Systems  Constitutional: Negative for fever, chills, weight loss, malaise/fatigue and diaphoresis.  HENT: Negative for hearing loss, ear pain,  nosebleeds, congestion, sore throat, neck pain, tinnitus and ear discharge.   Eyes: Negative for blurred vision, double vision, photophobia, pain, discharge and redness.  Respiratory: Negative for cough, hemoptysis, sputum production, shortness of breath, wheezing and stridor.   Cardiovascular: Negative for chest pain, palpitations, orthopnea, claudication, leg swelling and PND.  Gastrointestinal: negative for abdominal pain. Negative for heartburn, nausea, vomiting, diarrhea, constipation, blood in stool and melena.  Genitourinary: Negative for dysuria, urgency, frequency, hematuria and flank pain.  Musculoskeletal: Negative for myalgias, back pain, joint pain and falls.  Skin: Negative for itching and rash.  Neurological: Negative for dizziness, tingling, tremors, sensory change, speech change, focal weakness, seizures, loss of consciousness, weakness and headaches.  Endo/Heme/Allergies: Negative for environmental allergies and polydipsia. Does not bruise/bleed easily.  Psychiatric/Behavioral: Negative for depression, suicidal ideas, hallucinations, memory loss and substance abuse. The patient is not nervous/anxious and does not have insomnia.        Objective:  Blood pressure (!) 144/88, pulse (!) 57, height 5\' 5"  (1.651 m), weight 182 lb (82.6 kg).   Physical Exam  Vitals reviewed. Constitutional: She is oriented to person, place, and time. She appears well-developed and well-nourished.  HENT:  Head: Normocephalic and atraumatic.        Right Ear: External ear normal.  Left Ear: External ear normal.  Nose: Nose normal.  Mouth/Throat: Oropharynx is clear and moist.  Eyes: Conjunctivae and EOM are normal. Pupils are equal, round, and reactive to light. Right eye exhibits no discharge. Left eye exhibits no discharge. No scleral icterus.  Neck: Normal range of motion. Neck supple. No tracheal deviation present. No thyromegaly present.  Cardiovascular: Normal rate, regular rhythm, normal  heart sounds and intact distal pulses.  Exam reveals no gallop and no friction rub.   No murmur heard. Respiratory: Effort normal and breath sounds normal. No respiratory distress. She has no wheezes. She has no rales. She exhibits no tenderness.  GI: Soft. Bowel sounds are normal. She exhibits no distension and no mass. There is no tenderness. There is no rebound and no guarding.  Genitourinary:  Breasts no masses skin changes or nipple changes bilaterally      Vulva is normal without lesions Vagina is pink moist without discharge Cervix absent Uterus is absent  Adnexa is negative  {Rectal    hemoccult negative, normal tone, no masses  Musculoskeletal:  Normal range of motion. She exhibits no edema and no tenderness.  Neurological: She is alert and oriented to person, place, and time. She has normal reflexes. She displays normal reflexes. No cranial nerve deficit. She exhibits normal muscle tone. Coordination normal.  Skin: Skin is warm and dry. No rash noted. No erythema. No pallor.  Psychiatric: She has a normal mood and affect. Her behavior is normal. Judgment and thought content normal.       Medications Ordered at today's visit: No orders of the defined types were placed in this encounter.   Other orders placed at today's visit: Orders Placed This Encounter  Procedures   MM 3D SCREEN BREAST BILATERAL      Assessment:    Normal Gyn exam.    Plan:    Mammogram ordered. Follow up in: 3 years.   Or prn  Return in about 3 years (around 11/15/2024), or if symptoms worsen or fail to improve.

## 2021-12-27 ENCOUNTER — Other Ambulatory Visit: Payer: Self-pay

## 2021-12-27 ENCOUNTER — Ambulatory Visit (HOSPITAL_COMMUNITY)
Admission: RE | Admit: 2021-12-27 | Discharge: 2021-12-27 | Disposition: A | Discharge status: Home / Self Care | Payer: Medicare Other | Source: Ambulatory Visit | Attending: Obstetrics & Gynecology | Admitting: Obstetrics & Gynecology

## 2021-12-27 DIAGNOSIS — Z1231 Encounter for screening mammogram for malignant neoplasm of breast: Secondary | ICD-10-CM | POA: Insufficient documentation

## 2022-01-06 DIAGNOSIS — I10 Essential (primary) hypertension: Secondary | ICD-10-CM | POA: Diagnosis not present

## 2022-01-06 DIAGNOSIS — E785 Hyperlipidemia, unspecified: Secondary | ICD-10-CM | POA: Diagnosis not present

## 2022-03-12 DIAGNOSIS — M25511 Pain in right shoulder: Secondary | ICD-10-CM | POA: Diagnosis not present

## 2022-03-12 DIAGNOSIS — M25512 Pain in left shoulder: Secondary | ICD-10-CM | POA: Diagnosis not present

## 2022-03-12 DIAGNOSIS — I10 Essential (primary) hypertension: Secondary | ICD-10-CM | POA: Diagnosis not present

## 2022-03-12 DIAGNOSIS — E785 Hyperlipidemia, unspecified: Secondary | ICD-10-CM | POA: Diagnosis not present

## 2022-03-12 DIAGNOSIS — N390 Urinary tract infection, site not specified: Secondary | ICD-10-CM | POA: Diagnosis not present

## 2022-04-07 DIAGNOSIS — E785 Hyperlipidemia, unspecified: Secondary | ICD-10-CM | POA: Diagnosis not present

## 2022-04-07 DIAGNOSIS — I10 Essential (primary) hypertension: Secondary | ICD-10-CM | POA: Diagnosis not present

## 2022-06-07 DIAGNOSIS — I10 Essential (primary) hypertension: Secondary | ICD-10-CM | POA: Diagnosis not present

## 2022-06-07 DIAGNOSIS — E785 Hyperlipidemia, unspecified: Secondary | ICD-10-CM | POA: Diagnosis not present

## 2022-07-15 DIAGNOSIS — H43311 Vitreous membranes and strands, right eye: Secondary | ICD-10-CM | POA: Diagnosis not present

## 2022-07-16 DIAGNOSIS — I10 Essential (primary) hypertension: Secondary | ICD-10-CM | POA: Diagnosis not present

## 2022-07-16 DIAGNOSIS — E785 Hyperlipidemia, unspecified: Secondary | ICD-10-CM | POA: Diagnosis not present

## 2022-09-30 DIAGNOSIS — H43811 Vitreous degeneration, right eye: Secondary | ICD-10-CM | POA: Diagnosis not present

## 2022-10-08 ENCOUNTER — Encounter: Payer: Self-pay | Admitting: *Deleted

## 2022-10-30 ENCOUNTER — Encounter: Payer: Self-pay | Admitting: *Deleted

## 2022-10-30 NOTE — Patient Instructions (Signed)
  Procedure: colonoscopy  Estimated body mass index is 29.95 kg/m as calculated from the following:   Height as of this encounter: '5\' 5"'$  (1.651 m).   Weight as of this encounter: 180 lb (81.6 kg).   Have you had a colonoscopy before?  10/26/12, Dr. Oneida Alar  Do you have family history of colon cancer?  Yes, sister  Do you have a family history of polyps? yes  Previous colonoscopy with polyps removed? yes  Do you have a history colorectal cancer?   no  Are you diabetic?  no  Do you have a prosthetic or mechanical heart valve? no  Do you have a pacemaker/defibrillator?   no  Have you had endocarditis/atrial fibrillation?  no  Do you use supplemental oxygen/CPAP?  no  Have you had joint replacement within the last 12 months?  no  Do you tend to be constipated or have to use laxatives?  yes   Do you have history of alcohol use? If yes, how much and how often.  no  Do you have history or are you using drugs? If yes, what do are you  using?  no  Have you ever had a stroke/heart attack?  no  Have you ever had a heart or other vascular stent placed,?no  Do you take weight loss medication? no  female patients,: have you had a hysterectomy? yes                              are you post menopausal?                                do you still have your menstrual cycle? no    Date of last menstrual period? Years ago  Do you take any blood-thinning medications such as: (Plavix, aspirin, Coumadin, Aggrenox, Brilinta, Xarelto, Eliquis, Pradaxa, Savaysa or Effient)? Aspirin '81mg'$   If yes we need the name, milligram, dosage and who is prescribing doctor:               Current Outpatient Medications  Medication Sig Dispense Refill   amLODipine (NORVASC) 5 MG tablet Take 5 mg by mouth daily.     aspirin 81 MG tablet Take 81 mg by mouth daily.     Boswellia-Glucosamine-Vit D (OSTEO BI-FLEX ONE PER DAY PO) Take by mouth daily.     ezetimibe (ZETIA) 10 MG tablet Take 10 mg by mouth  daily.     fenofibrate (TRICOR) 48 MG tablet Take 48 mg by mouth daily.     fish oil-omega-3 fatty acids 1000 MG capsule Take 1 g by mouth daily.     hydrochlorothiazide (HYDRODIURIL) 25 MG tablet Take 25 mg by mouth as needed.     No current facility-administered medications for this visit.    Allergies  Allergen Reactions   Ceftriaxone Sodium Swelling   Linzess [Linaclotide]     HEART PALPITATIONS

## 2022-11-12 DIAGNOSIS — I10 Essential (primary) hypertension: Secondary | ICD-10-CM | POA: Diagnosis not present

## 2022-11-12 DIAGNOSIS — E785 Hyperlipidemia, unspecified: Secondary | ICD-10-CM | POA: Diagnosis not present

## 2022-11-25 NOTE — Progress Notes (Signed)
Ok to schedule. ASA 2.  Recommend bisacodyl '10mg'$  po daily for 3 days before clear liquids start.  Needs bmet.

## 2022-11-26 ENCOUNTER — Encounter: Payer: Self-pay | Admitting: *Deleted

## 2022-11-26 DIAGNOSIS — Z86010 Personal history of colonic polyps: Secondary | ICD-10-CM

## 2022-11-26 MED ORDER — PEG 3350-KCL-NA BICARB-NACL 420 G PO SOLR: 4000.0000 mL | Freq: Once | ORAL | 0 refills | Status: AC

## 2022-11-26 NOTE — Progress Notes (Signed)
LMOVM to call back to schedule with Dr. Abbey Chatters

## 2022-11-26 NOTE — Progress Notes (Signed)
Pt has been scheduled for 12/24/22 at 11:45 am, instructions mailed and prep sent to the pharmacy

## 2022-11-28 DIAGNOSIS — J069 Acute upper respiratory infection, unspecified: Secondary | ICD-10-CM | POA: Diagnosis not present

## 2022-12-10 NOTE — Progress Notes (Signed)
PA approved via [facility]. Auth# [id number], DOS: Dec 24, 2022 - Mar 24, 2023

## 2022-12-17 ENCOUNTER — Other Ambulatory Visit (HOSPITAL_COMMUNITY)
Admission: RE | Admit: 2022-12-17 | Discharge: 2022-12-17 | Disposition: A | Discharge status: Home / Self Care | Payer: Medicare Other | Source: Ambulatory Visit | Attending: Internal Medicine | Admitting: Internal Medicine

## 2022-12-17 DIAGNOSIS — Z86010 Personal history of colonic polyps: Secondary | ICD-10-CM | POA: Diagnosis not present

## 2022-12-17 LAB — BASIC METABOLIC PANEL
Anion gap: 9 (ref 5–15)
BUN: 15 mg/dL (ref 8–23)
CO2: 25 mmol/L (ref 22–32)
Calcium: 9.3 mg/dL (ref 8.9–10.3)
Chloride: 104 mmol/L (ref 98–111)
Creatinine, Ser: 0.98 mg/dL (ref 0.44–1.00)
GFR, Estimated: >60 mL/min (ref >60)
Glucose, Bld: 106 mg/dL — ABNORMAL HIGH (ref 70–99)
Potassium: 3.9 mmol/L (ref 3.5–5.1)
Sodium: 138 mmol/L (ref 135–145)

## 2022-12-24 ENCOUNTER — Encounter (HOSPITAL_COMMUNITY): Payer: Self-pay

## 2022-12-24 ENCOUNTER — Encounter (HOSPITAL_COMMUNITY)
Admission: RE | Disposition: A | Discharge status: Home / Self Care | Payer: Self-pay | Source: Home / Self Care | Attending: Internal Medicine

## 2022-12-24 ENCOUNTER — Ambulatory Visit (HOSPITAL_COMMUNITY): Payer: Medicare Other | Admitting: Anesthesiology

## 2022-12-24 ENCOUNTER — Ambulatory Visit (HOSPITAL_COMMUNITY)
Admission: RE | Admit: 2022-12-24 | Discharge: 2022-12-24 | Disposition: A | Discharge status: Home / Self Care | Payer: Medicare Other | Attending: Internal Medicine | Admitting: Internal Medicine

## 2022-12-24 ENCOUNTER — Ambulatory Visit (HOSPITAL_BASED_OUTPATIENT_CLINIC_OR_DEPARTMENT_OTHER): Payer: Medicare Other | Admitting: Anesthesiology

## 2022-12-24 ENCOUNTER — Other Ambulatory Visit: Payer: Self-pay

## 2022-12-24 DIAGNOSIS — Z800 Family history of malignant neoplasm of digestive organs: Secondary | ICD-10-CM | POA: Diagnosis not present

## 2022-12-24 DIAGNOSIS — Z86010 Personal history of colonic polyps: Secondary | ICD-10-CM | POA: Diagnosis not present

## 2022-12-24 DIAGNOSIS — Z1211 Encounter for screening for malignant neoplasm of colon: Secondary | ICD-10-CM | POA: Insufficient documentation

## 2022-12-24 DIAGNOSIS — Q438 Other specified congenital malformations of intestine: Secondary | ICD-10-CM | POA: Diagnosis not present

## 2022-12-24 DIAGNOSIS — K644 Residual hemorrhoidal skin tags: Secondary | ICD-10-CM | POA: Insufficient documentation

## 2022-12-24 HISTORY — PX: COLONOSCOPY WITH PROPOFOL: SHX5780

## 2022-12-24 SURGERY — COLONOSCOPY WITH PROPOFOL
Anesthesia: General

## 2022-12-24 MED ORDER — LACTATED RINGERS IV SOLN: INTRAVENOUS | Status: DC | PRN

## 2022-12-24 MED ORDER — PROPOFOL 10 MG/ML IV BOLUS
INTRAVENOUS | Status: DC | PRN
  Administered 2022-12-24 (×2): 50 mg via INTRAVENOUS
  Administered 2022-12-24: 100 mg via INTRAVENOUS
  Administered 2022-12-24 (×2): 50 mg via INTRAVENOUS

## 2022-12-24 MED ORDER — LIDOCAINE HCL (CARDIAC) PF 100 MG/5ML IV SOSY
PREFILLED_SYRINGE | INTRAVENOUS | Status: DC | PRN
  Administered 2022-12-24: 40 mg via INTRAVENOUS

## 2022-12-24 NOTE — Discharge Instructions (Addendum)
  Colonoscopy Discharge Instructions  Read the instructions outlined below and refer to this sheet in the next few weeks. These discharge instructions provide you with general information on caring for yourself after you leave the hospital. Your doctor may also give you specific instructions. While your treatment has been planned according to the most current medical practices available, unavoidable complications occasionally occur.   ACTIVITY You may resume your regular activity, but move at a slower pace for the next 24 hours.  Take frequent rest periods for the next 24 hours.  Walking will help get rid of the air and reduce the bloated feeling in your belly (abdomen).  No driving for 24 hours (because of the medicine (anesthesia) used during the test).   Do not sign any important legal documents or operate any machinery for 24 hours (because of the anesthesia used during the test).  NUTRITION Drink plenty of fluids.  You may resume your normal diet as instructed by your doctor.  Begin with a light meal and progress to your normal diet. Heavy or fried foods are harder to digest and may make you feel sick to your stomach (nauseated).  Avoid alcoholic beverages for 24 hours or as instructed.  MEDICATIONS You may resume your normal medications unless your doctor tells you otherwise.  WHAT YOU CAN EXPECT TODAY Some feelings of bloating in the abdomen.  Passage of more gas than usual.  Spotting of blood in your stool or on the toilet paper.  IF YOU HAD POLYPS REMOVED DURING THE COLONOSCOPY: No aspirin products for 7 days or as instructed.  No alcohol for 7 days or as instructed.  Eat a soft diet for the next 24 hours.  FINDING OUT THE RESULTS OF YOUR TEST Not all test results are available during your visit. If your test results are not back during the visit, make an appointment with your caregiver to find out the results. Do not assume everything is normal if you have not heard from your  caregiver or the medical facility. It is important for you to follow up on all of your test results.  SEEK IMMEDIATE MEDICAL ATTENTION IF: You have more than a spotting of blood in your stool.  Your belly is swollen (abdominal distention).  You are nauseated or vomiting.  You have a temperature over 101.  You have abdominal pain or discomfort that is severe or gets worse throughout the day.   Your colonoscopy was relatively unremarkable.  I did not find any polyps or evidence of colon cancer.  I recommend repeating colonoscopy in 10 years for colon cancer screening purposes.    For your constipation, I want you to start taking over the counter MiraLAX 1 capful daily.  If this does not adequately control your constipation, I would increase to 2 capfuls daily.  If this is still not adequate, then I would add on once daily Dulcolax (bisacodyl) tablet.   I also recommend increasing fiber in your diet or adding OTC Benefiber. Be sure to drink at least 4 to 6 glasses of water daily.    Follow-up with GI as needed.   I hope you have a great rest of your week!  Elon Alas. Abbey Chatters, D.O. Gastroenterology and Hepatology [facility]

## 2022-12-24 NOTE — Anesthesia Preprocedure Evaluation (Signed)
Anesthesia Evaluation  Patient identified by MRN, date of birth, ID band Patient awake    Reviewed: Allergy & Precautions, H&P , NPO status , Patient's Chart, lab work & pertinent test results, reviewed documented beta blocker date and time   Airway Mallampati: II  TM Distance: >3 FB Neck ROM: full    Dental no notable dental hx.    Pulmonary neg pulmonary ROS   Pulmonary exam normal breath sounds clear to auscultation       Cardiovascular Exercise Tolerance: Good negative cardio ROS  Rhythm:regular Rate:Normal     Neuro/Psych negative neurological ROS  negative psych ROS   GI/Hepatic negative GI ROS, Neg liver ROS,,,  Endo/Other  negative endocrine ROS    Renal/GU negative Renal ROS  negative genitourinary   Musculoskeletal   Abdominal   Peds  Hematology negative hematology ROS (+)   Anesthesia Other Findings   Reproductive/Obstetrics negative OB ROS                             Anesthesia Physical Anesthesia Plan  ASA: 2  Anesthesia Plan: General   Post-op Pain Management:    Induction:   PONV Risk Score and Plan: Propofol infusion  Airway Management Planned:   Additional Equipment:   Intra-op Plan:   Post-operative Plan:   Informed Consent: I have reviewed the patients History and Physical, chart, labs and discussed the procedure including the risks, benefits and alternatives for the proposed anesthesia with the patient or authorized representative who has indicated his/her understanding and acceptance.     Dental Advisory Given  Plan Discussed with: CRNA  Anesthesia Plan Comments:        Anesthesia Quick Evaluation  

## 2022-12-24 NOTE — H&P (Signed)
Primary Care Physician:  Iona Beard, MD Primary Gastroenterologist:  Dr. Abbey Chatters  Pre-Procedure History & Physical: HPI:  Samantha Clarke is a 67 y.o. female is here for a colonoscopy for colon cancer screening purposes.  Patient denies any family history of colorectal cancer.  No melena or hematochezia.  No abdominal pain or unintentional weight loss.  No change in bowel habits.  Overall feels well from a GI standpoint.  Past Medical History:  Diagnosis Date   Chronic constipation    Hyperlipidemia     Past Surgical History:  Procedure Laterality Date   ABDOMINAL HYSTERECTOMY     Anterior cervical diskectomy     with fusion of C6 to C7   CATARACT EXTRACTION, BILATERAL     COLONOSCOPY  12/05/2003   Rehman-Small external hemorrhoids, otherwise, normal but redundant colon with mild melanosis coli   COLONOSCOPY  10/26/2012   [id number] polyps measuring 2-4 mm in size were found in the ascending colon and rectum/ Small internal hemorrhoids   PARTIAL HYSTERECTOMY     right finger surgery      Prior to Admission medications   Medication Sig Start Date End Date Taking? Authorizing Provider  acetaminophen (TYLENOL) 500 MG tablet Take 500 mg by mouth every 6 (six) hours as needed for moderate pain.   Yes [provider]  amLODipine (NORVASC) 5 MG tablet Take 5 mg by mouth daily. 10/12/21  Yes [provider]  aspirin 81 MG tablet Take 81 mg by mouth daily.   Yes [provider]  Boswellia-Glucosamine-Vit D (OSTEO BI-FLEX ONE PER DAY PO) Take 1 tablet by mouth daily.   Yes [provider]  ciclopirox (PENLAC) 8 % solution Apply 1 Application topically at bedtime. 11/22/22  Yes [provider]  ezetimibe (ZETIA) 10 MG tablet Take 10 mg by mouth daily.   Yes [provider]  fenofibrate (TRICOR) 48 MG tablet Take 48 mg by mouth daily. 08/28/21  Yes [provider]  Omega-3 Fatty Acids (OMEGA 3 PO) Take 1 capsule by mouth daily.   Yes  [provider]  polyethylene glycol (MIRALAX / GLYCOLAX) 17 g packet Take 17 g by mouth daily.   Yes [provider]    Allergies as of 11/26/2022 - Review Complete 11/15/2021  Allergen Reaction Noted   Ceftriaxone sodium Swelling    Linzess [linaclotide]  03/10/2013    Family History  Problem Relation Age of Onset   Stroke Father    Heart failure Mother    Bone cancer Sister    Liver disease Sister    Stroke Sister    Colon cancer Neg Hx     Social History   Socioeconomic History   Marital status: Divorced    Spouse name: Not on file   Number of children: 1   Years of education: Not on file   Highest education level: Not on file  Occupational History   Occupation: English as a second language teacher: [employer]  Tobacco Use   Smoking status: Never   Smokeless tobacco: Never  Vaping Use   Vaping Use: Never used  Substance and Sexual Activity   Alcohol use: Yes    Comment: occ   Drug use: No   Sexual activity: Yes    Birth control/protection: Surgical  Other Topics Concern   Not on file  Social History Narrative   Lives alone   Social Determinants of Health   Financial Resource Strain: Low Risk  (11/15/2021)   Overall Financial Resource Strain (CARDIA)  Difficulty of Paying Living Expenses: Not hard at all  Food Insecurity: No Food Insecurity (11/15/2021)   Hunger Vital Sign    Worried About Running Out of Food in the Last Year: Never true    Ran Out of Food in the Last Year: Never true  Transportation Needs: No Transportation Needs (11/15/2021)   PRAPARE - Hydrologist (Medical): No    Lack of Transportation (Non-Medical): No  Physical Activity: Inactive (11/15/2021)   Exercise Vital Sign    Days of Exercise per Week: 3 days    Minutes of Exercise per Session: 0 min  Stress: No Stress Concern Present (11/15/2021)   [place]    Feeling of Stress : Only a little   Social Connections: Moderately Integrated (11/15/2021)   Social Connection and Isolation Panel [NHANES]    Frequency of Communication with Friends and Family: More than three times a week    Frequency of Social Gatherings with Friends and Family: Twice a week    Attends Religious Services: More than 4 times per year    Active Member of Clubs or Organizations: Yes    Attends Archivist Meetings: 1 to 4 times per year    Marital Status: Divorced  Human resources officer Violence: Not At Risk (11/15/2021)   Humiliation, Afraid, Rape, and Kick questionnaire    Fear of Current or Ex-Partner: No    Emotionally Abused: No    Physically Abused: No    Sexually Abused: No    Review of Systems: See HPI, otherwise negative ROS  Physical Exam: Vital signs in last 24 hours: Temp:  [98.4 F (36.9 C)] 98.4 F (36.9 C) (01/16 0653) Pulse Rate:  [76] 76 (01/16 0653) Resp:  [18] 18 (01/16 0653) BP: (122)/(88) 122/88 (01/16 0653) SpO2:  [99 %] 99 % (01/16 0653) Weight:  [81.6 kg] 81.6 kg (01/16 0653)   General:   Alert,  Well-developed, well-nourished, pleasant and cooperative in NAD Head:  Normocephalic and atraumatic. Eyes:  Sclera clear, no icterus.   Conjunctiva pink. Ears:  Normal auditory acuity. Nose:  No deformity, discharge,  or lesions. Msk:  Symmetrical without gross deformities. Normal posture. Extremities:  Without clubbing or edema. Neurologic:  Alert and  oriented x4;  grossly normal neurologically. Skin:  Intact without significant lesions or rashes. Psych:  Alert and cooperative. Normal mood and affect.  Impression/Plan: Samantha Clarke is here for a colonoscopy to be performed for colon cancer screening purposes.  The risks of the procedure including infection, bleed, or perforation as well as benefits, limitations, alternatives and imponderables have been reviewed with the patient. Questions have been answered. All parties agreeable.

## 2022-12-24 NOTE — Op Note (Signed)
[facility] Patient Name: Samantha Clarke Procedure Date: 12/24/2022 8:27 AM MRN: [id number] Date of Birth: 1956-01-17 Attending MD: Elon Alas. Abbey Chatters , [place], [id number] CSN: [id number] Age: 67 Admit Type: Outpatient Procedure:                Colonoscopy Indications:              Screening for colorectal malignant neoplasm Providers:                Elon Alas. Abbey Chatters, DO, Charlsie Quest. Theda Sers RN, RN,                            Illene Labrador Referring MD:              Medicines:                See the Anesthesia note for documentation of the                            administered medications Complications:            No immediate complications. Estimated Blood Loss:     Estimated blood loss: none. Procedure:                Pre-Anesthesia Assessment:                           - The anesthesia plan was to use monitored                            anesthesia care (MAC).                           After obtaining informed consent, the colonoscope                            was passed under direct vision. Throughout the                            procedure, the patient's blood pressure, pulse, and                            oxygen saturations were monitored continuously. The                            [id number]-HQ190L ([id number]) scope was introduced through                            the anus and advanced to the the cecum, identified                            by appendiceal orifice and ileocecal valve. The                            patient tolerated the procedure well. The quality                            of the bowel preparation  was evaluated using the                            BBPS [facility] Bowel Preparation Scale) with scores                            of: Right Colon = 2 (minor amount of residual                            staining, small fragments of stool and/or opaque                            liquid, but mucosa seen well), Transverse Colon = 3                            (entire mucosa  seen well with no residual staining,                            small fragments of stool or opaque liquid) and Left                            Colon = 3 (entire mucosa seen well with no residual                            staining, small fragments of stool or opaque                            liquid). The total BBPS score equals 8. The quality                            of the bowel preparation was good. The colonoscopy                            was technically difficult and complex due to a                            redundant colon and significant looping. Successful                            completion of the procedure was aided by changing                            the patient to a supine position and applying                            abdominal pressure. Scope In: 8:38:18 AM Scope Out: 9:02:35 AM Scope Withdrawal Time: 0 hours 8 minutes 35 seconds  Total Procedure Duration: 0 hours 24 minutes 17 seconds  Findings:      Skin tags were found on perianal exam.      The colon (entire examined portion) was significantly redundant with       significant looping. Advancing the scope required changing the patient  to a supine position and applying abdominal pressure.      The exam was otherwise without abnormality. Impression:               - Perianal skin tags found on perianal exam.                           - Redundant colon.                           - The examination was otherwise normal.                           - No specimens collected. Moderate Sedation:      Per Anesthesia Care Recommendation:           - Patient has a contact number available for                            emergencies. The signs and symptoms of potential                            delayed complications were discussed with the                            patient. Return to normal activities tomorrow.                            Written discharge instructions were provided to the                             patient.                           - Resume previous diet.                           - Continue present medications.                           - Repeat colonoscopy in 10 years for screening                            purposes.                           - Return to GI clinic PRN. Procedure Code(s):        --- Professional ---                           Z6109, Colorectal cancer screening; colonoscopy on                            individual not meeting criteria for high risk Diagnosis Code(s):        --- Professional ---                           Z12.11, Encounter for screening for  malignant                            neoplasm of colon                           K64.4, Residual hemorrhoidal skin tags                           Q43.8, Other specified congenital malformations of                            intestine CPT copyright 2022 [employer]. All rights reserved. The codes documented in this report are preliminary and upon coder review may  be revised to meet current compliance requirements. Elon Alas. Abbey Chatters, DO [place] Abbey Chatters, DO 12/24/2022 9:05:53 AM This report has been signed electronically. Number of Addenda: 0

## 2022-12-24 NOTE — Transfer of Care (Signed)
Immediate Anesthesia Transfer of Care Note  Patient: Samantha Clarke  Procedure(s) Performed: COLONOSCOPY WITH PROPOFOL  Patient Location: Endoscopy Unit  Anesthesia Type:General  Level of Consciousness: awake, alert , and oriented  Airway & Oxygen Therapy: Patient Spontanous Breathing  Post-op Assessment: Report given to RN and Post -op Vital signs reviewed and stable  Post vital signs: Reviewed and stable  Last Vitals:  Vitals Value Taken Time  BP 117/74 12/24/22 0905  Temp 36.4 C 12/24/22 0904  Pulse 94 12/24/22 0905  Resp 18 12/24/22 0905  SpO2 97 % 12/24/22 0905  Vitals shown include unvalidated device data.  Last Pain:  Vitals:   12/24/22 0904  TempSrc: Oral  PainSc: 0-No pain      Patients Stated Pain Goal: 4 (10/08/58 4585)  Complications: No notable events documented.

## 2022-12-25 NOTE — Anesthesia Postprocedure Evaluation (Signed)
Anesthesia Post Note  Patient: Samantha Clarke  Procedure(s) Performed: COLONOSCOPY WITH PROPOFOL  Patient location during evaluation: Phase II Anesthesia Type: General Level of consciousness: awake Pain management: pain level controlled Vital Signs Assessment: post-procedure vital signs reviewed and stable Respiratory status: spontaneous breathing and respiratory function stable Cardiovascular status: blood pressure returned to baseline and stable Postop Assessment: no headache and no apparent nausea or vomiting Anesthetic complications: no Comments: Late entry   No notable events documented.   Last Vitals:  Vitals:   12/24/22 0653 12/24/22 0904  BP: 122/88 117/74  Pulse: 76 94  Resp: 18 (!) 21  Temp: 36.9 C 36.4 C  SpO2: 99% 97%    Last Pain:  Vitals:   12/24/22 0904  TempSrc: Oral  PainSc: 0-No pain                 Louann Sjogren

## 2022-12-30 ENCOUNTER — Encounter (HOSPITAL_COMMUNITY): Payer: Self-pay | Admitting: Internal Medicine

## 2023-01-01 DIAGNOSIS — Z1231 Encounter for screening mammogram for malignant neoplasm of breast: Secondary | ICD-10-CM | POA: Diagnosis not present

## 2023-02-26 DIAGNOSIS — U071 COVID-19: Secondary | ICD-10-CM | POA: Diagnosis not present

## 2023-03-24 DIAGNOSIS — E785 Hyperlipidemia, unspecified: Secondary | ICD-10-CM | POA: Diagnosis not present

## 2023-03-24 DIAGNOSIS — I10 Essential (primary) hypertension: Secondary | ICD-10-CM | POA: Diagnosis not present

## 2023-04-22 DIAGNOSIS — I10 Essential (primary) hypertension: Secondary | ICD-10-CM | POA: Diagnosis not present

## 2023-04-22 DIAGNOSIS — M25552 Pain in left hip: Secondary | ICD-10-CM | POA: Diagnosis not present

## 2023-05-30 ENCOUNTER — Encounter (HOSPITAL_COMMUNITY): Payer: Self-pay | Admitting: Family Medicine

## 2023-05-30 ENCOUNTER — Other Ambulatory Visit (HOSPITAL_COMMUNITY): Payer: Self-pay | Admitting: Family Medicine

## 2023-05-30 DIAGNOSIS — M25552 Pain in left hip: Secondary | ICD-10-CM

## 2023-06-03 ENCOUNTER — Ambulatory Visit (HOSPITAL_COMMUNITY)
Admission: RE | Admit: 2023-06-03 | Discharge: 2023-06-03 | Disposition: A | Discharge status: Home / Self Care | Payer: Medicare Other | Source: Ambulatory Visit | Attending: Family Medicine | Admitting: Family Medicine

## 2023-06-03 DIAGNOSIS — M25552 Pain in left hip: Secondary | ICD-10-CM | POA: Insufficient documentation

## 2023-06-03 DIAGNOSIS — M545 Low back pain, unspecified: Secondary | ICD-10-CM | POA: Diagnosis not present

## 2023-06-03 DIAGNOSIS — M4316 Spondylolisthesis, lumbar region: Secondary | ICD-10-CM | POA: Diagnosis not present

## 2023-06-03 DIAGNOSIS — M47816 Spondylosis without myelopathy or radiculopathy, lumbar region: Secondary | ICD-10-CM | POA: Diagnosis not present

## 2023-06-03 DIAGNOSIS — M1612 Unilateral primary osteoarthritis, left hip: Secondary | ICD-10-CM | POA: Diagnosis not present

## 2023-06-03 DIAGNOSIS — M5136 Other intervertebral disc degeneration, lumbar region: Secondary | ICD-10-CM | POA: Diagnosis not present

## 2023-06-23 DIAGNOSIS — E7800 Pure hypercholesterolemia, unspecified: Secondary | ICD-10-CM | POA: Diagnosis not present

## 2023-06-23 DIAGNOSIS — M25552 Pain in left hip: Secondary | ICD-10-CM | POA: Diagnosis not present

## 2023-06-23 DIAGNOSIS — I10 Essential (primary) hypertension: Secondary | ICD-10-CM | POA: Diagnosis not present

## 2023-07-21 DIAGNOSIS — H524 Presbyopia: Secondary | ICD-10-CM | POA: Diagnosis not present

## 2023-07-21 DIAGNOSIS — Z961 Presence of intraocular lens: Secondary | ICD-10-CM | POA: Diagnosis not present

## 2023-09-02 DIAGNOSIS — B351 Tinea unguium: Secondary | ICD-10-CM | POA: Diagnosis not present

## 2023-09-02 DIAGNOSIS — L603 Nail dystrophy: Secondary | ICD-10-CM | POA: Diagnosis not present

## 2023-09-18 ENCOUNTER — Encounter: Payer: Self-pay | Admitting: Emergency Medicine

## 2023-09-18 ENCOUNTER — Ambulatory Visit
Admission: EM | Admit: 2023-09-18 | Discharge: 2023-09-18 | Disposition: A | Discharge status: Home / Self Care | Payer: Medicare Other | Attending: Nurse Practitioner | Admitting: Nurse Practitioner

## 2023-09-18 ENCOUNTER — Other Ambulatory Visit: Payer: Self-pay

## 2023-09-18 DIAGNOSIS — J069 Acute upper respiratory infection, unspecified: Secondary | ICD-10-CM | POA: Diagnosis not present

## 2023-09-18 DIAGNOSIS — Z1152 Encounter for screening for COVID-19: Secondary | ICD-10-CM | POA: Insufficient documentation

## 2023-09-18 LAB — POCT INFLUENZA A/B
Influenza A, POC: NEGATIVE
Influenza B, POC: NEGATIVE

## 2023-09-18 MED ORDER — PROMETHAZINE-DM 6.25-15 MG/5ML PO SYRP: 5.0000 mL | ORAL_SOLUTION | Freq: Four times a day (QID) | ORAL | 0 refills | Status: AC | PRN

## 2023-09-18 MED ORDER — CETIRIZINE-PSEUDOEPHEDRINE ER 5-120 MG PO TB12: 1.0000 | ORAL_TABLET | Freq: Every day | ORAL | 0 refills | Status: DC

## 2023-09-18 MED ORDER — FLUTICASONE PROPIONATE 50 MCG/ACT NA SUSP: 2.0000 | Freq: Every day | NASAL | 0 refills | Status: DC

## 2023-09-18 NOTE — ED Provider Notes (Signed)
[facility]    CSN: [id number] Arrival date & time: 09/18/23  0904      History   Chief Complaint Chief Complaint  Patient presents with   Generalized Body Aches    HPI Samantha Clarke is a 67 y.o. female.   The history is provided by the patient.   Patient presents for complaints of chills, body aches, cough, and nasal drainage.  Symptoms have been present for the 6 days.  She suspects that she may have also had a fever, which is since resolved, she also reports that she had a sore throat, which has also improved.  Patient denies headache, ear pain, ear drainage, wheezing, difficulty breathing, chest pain, abdominal pain, nausea, vomiting, or diarrhea.  Patient denies any obvious known sick contacts.  Reports she has been taking over-the-counter Alka-Seltzer for her symptoms with minimal relief.  Past Medical History:  Diagnosis Date   Chronic constipation    Hyperlipidemia     Patient Active Problem List   Diagnosis Date Noted   Colon cancer screening 11/08/2015   Chronic constipation 10/06/2012   SHOULDER PAIN 11/28/2008   IMPINGEMENT SYNDROME 11/28/2008    Past Surgical History:  Procedure Laterality Date   ABDOMINAL HYSTERECTOMY     Anterior cervical diskectomy     with fusion of C6 to C7   CATARACT EXTRACTION, BILATERAL     COLONOSCOPY  12/05/2003   Rehman-Small external hemorrhoids, otherwise, normal but redundant colon with mild melanosis coli   COLONOSCOPY  10/26/2012   ZDG:UYQIH polyps measuring 2-4 mm in size were found in the ascending colon and rectum/ Small internal hemorrhoids   COLONOSCOPY WITH PROPOFOL N/A 12/24/2022   Procedure: COLONOSCOPY WITH PROPOFOL;  Surgeon: Lanelle Bal, DO;  Location: [facility];  Service: Endoscopy;  Laterality: N/A;  11:45 am, pt knows to arrive at 7:00   PARTIAL HYSTERECTOMY     right finger surgery      OB History     Gravida  1   Para  1   Term  1   Preterm      AB      Living  1       SAB      IAB      Ectopic      Multiple      Live Births  1            Home Medications    Prior to Admission medications   Medication Sig Start Date End Date Taking? Authorizing Provider  cetirizine-pseudoephedrine (ZYRTEC-D) 5-120 MG tablet Take 1 tablet by mouth daily. 09/18/23  Yes Aleana Fifita-Warren, Sadie Haber, NP  fluticasone (FLONASE) 50 MCG/ACT nasal spray Place 2 sprays into both nostrils daily. 09/18/23  Yes Sabrinna Yearwood-Warren, Sadie Haber, NP  promethazine-dextromethorphan (PROMETHAZINE-DM) 6.25-15 MG/5ML syrup Take 5 mLs by mouth 4 (four) times daily as needed. 09/18/23  Yes Lillee Mooneyhan-Warren, Sadie Haber, NP  acetaminophen (TYLENOL) 500 MG tablet Take 500 mg by mouth every 6 (six) hours as needed for moderate pain.    [provider]  amLODipine (NORVASC) 5 MG tablet Take 5 mg by mouth daily. 10/12/21   [provider]  aspirin 81 MG tablet Take 81 mg by mouth daily.    [provider]  Boswellia-Glucosamine-Vit D (OSTEO BI-FLEX ONE PER DAY PO) Take 1 tablet by mouth daily.    [provider]  ciclopirox (PENLAC) 8 % solution Apply 1 Application topically at bedtime. 11/22/22   [provider]  ezetimibe (ZETIA) 10 MG tablet Take 10 mg by mouth daily.    [provider]  fenofibrate (TRICOR) 48 MG tablet Take 48 mg by mouth daily. 08/28/21   [provider]  Omega-3 Fatty Acids (OMEGA 3 PO) Take 1 capsule by mouth daily.    [provider]  polyethylene glycol (MIRALAX / GLYCOLAX) 17 g packet Take 17 g by mouth daily.    [provider]    Family History Family History  Problem Relation Age of Onset   Stroke Father    Heart failure Mother    Bone cancer Sister    Liver disease Sister    Stroke Sister    Colon cancer Neg Hx     Social History Social History   Tobacco Use   Smoking status: Never   Smokeless tobacco: Never  Vaping Use   Vaping status: Never Used  Substance Use Topics    Alcohol use: Yes    Comment: occ   Drug use: No     Allergies   Ceftriaxone sodium and Linzess [linaclotide]   Review of Systems Review of Systems Per HPI  Physical Exam Triage Vital Signs ED Triage Vitals  Encounter Vitals Group     BP 09/18/23 0930 133/84     Systolic BP Percentile --      Diastolic BP Percentile --      Pulse Rate 09/18/23 0930 76     Resp 09/18/23 0930 20     Temp 09/18/23 0930 98.4 F (36.9 C)     Temp Source 09/18/23 0930 Oral     SpO2 09/18/23 0930 98 %     Weight --      Height --      Head Circumference --      Peak Flow --      Pain Score 09/18/23 0929 2     Pain Loc --      Pain Education --      Exclude from Growth Chart --    No data found.  Updated Vital Signs BP 133/84 (BP Location: Right Arm)   Pulse 76   Temp 98.4 F (36.9 C) (Oral)   Resp 20   SpO2 98%   Visual Acuity Right Eye Distance:   Left Eye Distance:   Bilateral Distance:    Right Eye Near:   Left Eye Near:    Bilateral Near:     Physical Exam Vitals and nursing note reviewed.  Constitutional:      General: She is not in acute distress.    Appearance: Normal appearance.  HENT:     Head: Normocephalic.     Right Ear: Tympanic membrane, ear canal and external ear normal.     Left Ear: Tympanic membrane, ear canal and external ear normal.     Nose: Congestion present.     Mouth/Throat:     Mouth: Mucous membranes are moist.     Comments: Cobblestoning present to posterior oropharynx  Eyes:     Extraocular Movements: Extraocular movements intact.     Conjunctiva/sclera: Conjunctivae normal.     Pupils: Pupils are equal, round, and reactive to light.  Cardiovascular:     Rate and Rhythm: Normal rate and regular rhythm.     Pulses: Normal pulses.     Heart sounds: Normal heart sounds.  Pulmonary:     Effort: Pulmonary effort is normal. No respiratory distress.     Breath sounds: Normal breath sounds. No stridor. No wheezing, rhonchi or  rales.   Abdominal:     General: Bowel sounds are normal.     Palpations: Abdomen is soft.     Tenderness: There is no abdominal tenderness.  Musculoskeletal:     Cervical back: Normal range of motion.  Skin:    General: Skin is warm and dry.  Neurological:     General: No focal deficit present.     Mental Status: She is alert and oriented to person, place, and time.  Psychiatric:        Mood and Affect: Mood normal.        Behavior: Behavior normal.      UC Treatments / Results  Labs (all labs ordered are listed, but only abnormal results are displayed) Labs Reviewed  SARS CORONAVIRUS 2 (TAT 6-24 HRS)  POCT INFLUENZA A/B    EKG   Radiology No results found.  Procedures Procedures (including critical care time)  Medications Ordered in UC Medications - No data to display  Initial Impression / Assessment and Plan / UC Course  I have reviewed the triage vital signs and the nursing notes.  Pertinent labs & imaging results that were available during my care of the patient were reviewed by me and considered in my medical decision making (see chart for details).  The patient is well-appearing, she is in no acute distress, vital signs are stable.  Influenza test was negative, COVID test is pending.  Patient was advised she was out of the window to begin treatment if her COVID test is positive.  Will provide symptomatic treatment with Promethazine DM for her cough, cetirizine D 04-2019 mg for nasal congestion and postnasal drainage, and fluticasone 50 mcg nasal spray for nasal congestion, and postnasal drainage.  Supportive care recommendations were provided and discussed with the patient to include over-the-counter analgesics, warm salt water gargles, normal saline nasal spray, and use of a humidifier in the bedroom at nighttime during sleep.  Discussed indications with the patient of when follow-up will be necessary.  Patient is in agreement with this plan of care and verbalizes  understanding.  All questions were answered.  Patient stable for discharge.  Final Clinical Impressions(s) / UC Diagnoses   Final diagnoses:  Viral upper respiratory tract infection with cough  Encounter for screening for COVID-19     Discharge Instructions      It appears you have a viral upper respiratory infection..  Symptoms should improve over the next week to 10 days.  If you develop chest pain or shortness of breath, go to the emergency room.   We have tested you today for COVID-19 and influenza.  Your influenza test was negative, your COVID test is pending.  You will see the results in Mychart and we will call you with positive results. Please stay home and isolate until you are aware of the results.     Some things that can make you feel better are: - Increased rest - Increasing fluid with water/sugar free electrolytes - Acetaminophen and ibuprofen as needed for fever/pain - Salt water gargling, chloraseptic spray and throat lozenges -Sleeping elevated on pillows while cough symptoms persist - OTC guaifenesin (Mucinex) 600 mg twice daily - Saline sinus flushes or a neti pot - Humidifying the air  As discussed, if symptoms do not improve over the next 5 to 7 days, or if they appear to be worsening, please follow-up in this clinic or with your primary care physician for further evaluation. Follow-up as needed.     ED  Prescriptions     Medication Sig Dispense Auth. Provider   promethazine-dextromethorphan (PROMETHAZINE-DM) 6.25-15 MG/5ML syrup Take 5 mLs by mouth 4 (four) times daily as needed. 118 mL Oren Barella-Warren, Sadie Haber, NP   fluticasone (FLONASE) 50 MCG/ACT nasal spray Place 2 sprays into both nostrils daily. 16 g Aloma Boch-Warren, Sadie Haber, NP   cetirizine-pseudoephedrine (ZYRTEC-D) 5-120 MG tablet Take 1 tablet by mouth daily. 30 tablet Marga Gramajo-Warren, Sadie Haber, NP      PDMP not reviewed this encounter.   Abran Cantor, NP 09/18/23 [id number]

## 2023-09-18 NOTE — Discharge Instructions (Addendum)
It appears you have a viral upper respiratory infection..  Symptoms should improve over the next week to 10 days.  If you develop chest pain or shortness of breath, go to the emergency room.   We have tested you today for COVID-19 and influenza.  Your influenza test was negative, your COVID test is pending.  You will see the results in Mychart and we will call you with positive results. Please stay home and isolate until you are aware of the results.     Some things that can make you feel better are: - Increased rest - Increasing fluid with water/sugar free electrolytes - Acetaminophen and ibuprofen as needed for fever/pain - Salt water gargling, chloraseptic spray and throat lozenges -Sleeping elevated on pillows while cough symptoms persist - OTC guaifenesin (Mucinex) 600 mg twice daily - Saline sinus flushes or a neti pot - Humidifying the air  As discussed, if symptoms do not improve over the next 5 to 7 days, or if they appear to be worsening, please follow-up in this clinic or with your primary care physician for further evaluation. Follow-up as needed.

## 2023-09-18 NOTE — ED Triage Notes (Signed)
Pt reports chills, body aches beginning of week. Pt reports those have subsided but now states cough and nasal drainage "that chokes me up at times ".   Pt inquiring about covid test.   Flu test also obtained.

## 2023-09-19 LAB — SARS CORONAVIRUS 2 (TAT 6-24 HRS): SARS Coronavirus 2: NEGATIVE

## 2023-09-22 DIAGNOSIS — E7800 Pure hypercholesterolemia, unspecified: Secondary | ICD-10-CM | POA: Diagnosis not present

## 2023-09-22 DIAGNOSIS — J069 Acute upper respiratory infection, unspecified: Secondary | ICD-10-CM | POA: Diagnosis not present

## 2023-09-22 DIAGNOSIS — I10 Essential (primary) hypertension: Secondary | ICD-10-CM | POA: Diagnosis not present

## 2023-12-23 DIAGNOSIS — E7800 Pure hypercholesterolemia, unspecified: Secondary | ICD-10-CM | POA: Diagnosis not present

## 2023-12-23 DIAGNOSIS — I10 Essential (primary) hypertension: Secondary | ICD-10-CM | POA: Diagnosis not present

## 2023-12-23 DIAGNOSIS — M150 Primary generalized (osteo)arthritis: Secondary | ICD-10-CM | POA: Diagnosis not present

## 2024-01-16 DIAGNOSIS — N644 Mastodynia: Secondary | ICD-10-CM | POA: Diagnosis not present

## 2024-03-16 ENCOUNTER — Ambulatory Visit: Admission: EM | Admit: 2024-03-16 | Discharge: 2024-03-16 | Disposition: A | Discharge status: Home / Self Care

## 2024-03-16 DIAGNOSIS — H9312 Tinnitus, left ear: Secondary | ICD-10-CM | POA: Diagnosis not present

## 2024-03-16 DIAGNOSIS — H6993 Unspecified Eustachian tube disorder, bilateral: Secondary | ICD-10-CM | POA: Diagnosis not present

## 2024-03-16 NOTE — Discharge Instructions (Signed)
 Recommend starting an oral antihistamine like cetirizine, fexofenadine, or loratadine daily.  In addition, recommend fluticasone nasal spray daily as well.  Seek care if symptoms do not improve with treatment.

## 2024-03-16 NOTE — ED Provider Notes (Signed)
 [facility]    CSN: [id number] Arrival date & time: 03/16/24  1010      History   Chief Complaint Chief Complaint  Patient presents with   Otalgia    HPI Samantha Clarke is a 68 y.o. female.   Patient presents today with 1 day history of left ear "thumping."  She denies any recent ear pain or trauma, cough, congestion, fever.  No ear drainage or decreased hearing from the left ear.  She does report she has had some drainage in the back of her throat and feels like she has to clear her throat or get something up.  She denies history of allergies, however has taken allergy medicine in the past.    Past Medical History:  Diagnosis Date   Chronic constipation    Hyperlipidemia     Patient Active Problem List   Diagnosis Date Noted   Colon cancer screening 11/08/2015   Chronic constipation 10/06/2012   SHOULDER PAIN 11/28/2008   IMPINGEMENT SYNDROME 11/28/2008    Past Surgical History:  Procedure Laterality Date   ABDOMINAL HYSTERECTOMY     Anterior cervical diskectomy     with fusion of C6 to C7   CATARACT EXTRACTION, BILATERAL     COLONOSCOPY  12/05/2003   Rehman-Small external hemorrhoids, otherwise, normal but redundant colon with mild melanosis coli   COLONOSCOPY  10/26/2012   WUJ:WJXBJ polyps measuring 2-4 mm in size were found in the ascending colon and rectum/ Small internal hemorrhoids   COLONOSCOPY WITH PROPOFOL N/A 12/24/2022   Procedure: COLONOSCOPY WITH PROPOFOL;  Surgeon: Lanelle Bal, DO;  [facility];  Service: Endoscopy;  Laterality: N/A;  11:45 am, pt knows to arrive at 7:00   PARTIAL HYSTERECTOMY     right finger surgery      OB History     Gravida  1   Para  1   Term  1   Preterm      AB      Living  1      SAB      IAB      Ectopic      Multiple      Live Births  1            Home Medications    Prior to Admission medications   Medication Sig Start Date End Date Taking? Authorizing  Provider  acetaminophen (TYLENOL) 500 MG tablet Take 500 mg by mouth every 6 (six) hours as needed for moderate pain.    [provider]  amLODipine (NORVASC) 5 MG tablet Take 5 mg by mouth daily. 10/12/21   [provider]  aspirin 81 MG tablet Take 81 mg by mouth daily.    [provider]  Boswellia-Glucosamine-Vit D (OSTEO BI-FLEX ONE PER DAY PO) Take 1 tablet by mouth daily.    [provider]  cetirizine-pseudoephedrine (ZYRTEC-D) 5-120 MG tablet Take 1 tablet by mouth daily. 09/18/23   Leath-Warren, Sadie Haber, NP  ciclopirox (PENLAC) 8 % solution Apply 1 Application topically at bedtime. 11/22/22   [provider]  ezetimibe (ZETIA) 10 MG tablet Take 10 mg by mouth daily.    [provider]  fenofibrate (TRICOR) 48 MG tablet Take 48 mg by mouth daily. 08/28/21   [provider]  fluticasone (FLONASE) 50 MCG/ACT nasal spray Place 2 sprays into both nostrils daily. 09/18/23   Leath-Warren, Sadie Haber, NP  Omega-3 Fatty Acids (OMEGA 3 PO) Take 1 capsule by  mouth daily.    [provider]  polyethylene glycol (MIRALAX / GLYCOLAX) 17 g packet Take 17 g by mouth daily.    [provider]  promethazine-dextromethorphan (PROMETHAZINE-DM) 6.25-15 MG/5ML syrup Take 5 mLs by mouth 4 (four) times daily as needed. 09/18/23   Leath-Warren, Sadie Haber, NP    Family History Family History  Problem Relation Age of Onset   Stroke Father    Heart failure Mother    Bone cancer Sister    Liver disease Sister    Stroke Sister    Colon cancer Neg Hx     Social History Social History   Tobacco Use   Smoking status: Never   Smokeless tobacco: Never  Vaping Use   Vaping status: Never Used  Substance Use Topics   Alcohol use: Yes    Comment: occ   Drug use: No     Allergies   Ceftriaxone sodium and Linzess [linaclotide]   Review of Systems Review of Systems Per HPI  Physical Exam Triage Vital Signs ED Triage  Vitals  Encounter Vitals Group     BP 03/16/24 1029 135/79     Systolic BP Percentile --      Diastolic BP Percentile --      Pulse Rate 03/16/24 1029 65     Resp 03/16/24 1029 16     Temp 03/16/24 1029 98.2 F (36.8 C)     Temp Source 03/16/24 1029 Oral     SpO2 03/16/24 1029 98 %     Weight --      Height --      Head Circumference --      Peak Flow --      Pain Score 03/16/24 1030 0     Pain Loc --      Pain Education --      Exclude from Growth Chart --    No data found.  Updated Vital Signs BP 135/79 (BP Location: Right Arm)   Pulse 65   Temp 98.2 F (36.8 C) (Oral)   Resp 16   SpO2 98%   Visual Acuity Right Eye Distance:   Left Eye Distance:   Bilateral Distance:    Right Eye Near:   Left Eye Near:    Bilateral Near:     Physical Exam Vitals and nursing note reviewed.  Constitutional:      General: She is not in acute distress.    Appearance: She is well-developed. She is not toxic-appearing.  HENT:     Head: Normocephalic and atraumatic.     Right Ear: Ear canal normal. Decreased hearing noted. No drainage or swelling. A middle ear effusion is present. There is no impacted cerumen. Tympanic membrane is not erythematous, retracted or bulging.     Left Ear: Ear canal normal. Decreased hearing noted. No drainage or swelling. A middle ear effusion is present. There is no impacted cerumen. Tympanic membrane is not erythematous, retracted or bulging.     Nose: Nose normal. No congestion or rhinorrhea.     Mouth/Throat:     Mouth: Mucous membranes are moist.     Pharynx: Oropharynx is clear. Uvula midline. Postnasal drip present.     Tonsils: No tonsillar exudate.  Eyes:     Extraocular Movements:     Right eye: Normal extraocular motion.     Left eye: Normal extraocular motion.  Cardiovascular:     Rate and Rhythm: Normal rate and regular rhythm.  Pulmonary:     Effort: Pulmonary  effort is normal. No respiratory distress.  Musculoskeletal:     Cervical  back: Normal range of motion.  Skin:    General: Skin is warm and dry.     Capillary Refill: Capillary refill takes less than 2 seconds.     Coloration: Skin is not pale.     Findings: No erythema or rash.  Neurological:     Mental Status: She is alert and oriented to person, place, and time.  Psychiatric:        Behavior: Behavior is cooperative.      UC Treatments / Results  Labs (all labs ordered are listed, but only abnormal results are displayed) Labs Reviewed - No data to display  EKG   Radiology No results found.  Procedures Procedures (including critical care time)  Medications Ordered in UC Medications - No data to display  Initial Impression / Assessment and Plan / UC Course  I have reviewed the triage vital signs and the nursing notes.  Pertinent labs & imaging results that were available during my care of the patient were reviewed by me and considered in my medical decision making (see chart for details).   Patient is well-appearing, normotensive, afebrile, not tachycardic, not tachypneic, oxygenating well on room air.    1. Acute dysfunction of Eustachian tube, bilateral 2. Tinnitus of left ear Treat with oral antihistamine and Flonase nasal spray Return and ER precautions discussed  The patient was given the opportunity to ask questions.  All questions answered to their satisfaction.  The patient is in agreement to this plan.   Final Clinical Impressions(s) / UC Diagnoses   Final diagnoses:  Acute dysfunction of Eustachian tube, bilateral  Tinnitus of left ear     Discharge Instructions      Recommend starting an oral antihistamine like cetirizine, fexofenadine, or loratadine daily.  In addition, recommend fluticasone nasal spray daily as well.  Seek care if symptoms do not improve with treatment.    ED Prescriptions   None    PDMP not reviewed this encounter.   Valentino Nose, NP 03/16/24 1056

## 2024-03-16 NOTE — ED Triage Notes (Signed)
 Pt reports she has some "thumping" in her left ear x 1 day

## 2024-03-29 DIAGNOSIS — M150 Primary generalized (osteo)arthritis: Secondary | ICD-10-CM | POA: Diagnosis not present

## 2024-03-29 DIAGNOSIS — I10 Essential (primary) hypertension: Secondary | ICD-10-CM | POA: Diagnosis not present

## 2024-03-29 DIAGNOSIS — E7800 Pure hypercholesterolemia, unspecified: Secondary | ICD-10-CM | POA: Diagnosis not present

## 2024-06-28 DIAGNOSIS — I10 Essential (primary) hypertension: Secondary | ICD-10-CM | POA: Diagnosis not present

## 2024-06-28 DIAGNOSIS — E7800 Pure hypercholesterolemia, unspecified: Secondary | ICD-10-CM | POA: Diagnosis not present

## 2024-06-28 DIAGNOSIS — M1612 Unilateral primary osteoarthritis, left hip: Secondary | ICD-10-CM | POA: Diagnosis not present

## 2024-07-19 DIAGNOSIS — M545 Low back pain, unspecified: Secondary | ICD-10-CM | POA: Diagnosis not present

## 2024-07-19 DIAGNOSIS — M25552 Pain in left hip: Secondary | ICD-10-CM | POA: Diagnosis not present

## 2024-09-13 DIAGNOSIS — M1612 Unilateral primary osteoarthritis, left hip: Secondary | ICD-10-CM | POA: Diagnosis not present

## 2024-09-13 DIAGNOSIS — M5459 Other low back pain: Secondary | ICD-10-CM | POA: Diagnosis not present

## 2024-09-20 DIAGNOSIS — M25552 Pain in left hip: Secondary | ICD-10-CM | POA: Diagnosis not present

## 2024-09-20 DIAGNOSIS — M1612 Unilateral primary osteoarthritis, left hip: Secondary | ICD-10-CM | POA: Diagnosis not present

## 2024-09-20 DIAGNOSIS — E7800 Pure hypercholesterolemia, unspecified: Secondary | ICD-10-CM | POA: Diagnosis not present

## 2024-09-20 DIAGNOSIS — I10 Essential (primary) hypertension: Secondary | ICD-10-CM | POA: Diagnosis not present

## 2024-09-21 DIAGNOSIS — H2513 Age-related nuclear cataract, bilateral: Secondary | ICD-10-CM | POA: Diagnosis not present

## 2024-09-21 DIAGNOSIS — H524 Presbyopia: Secondary | ICD-10-CM | POA: Diagnosis not present

## 2024-09-27 DIAGNOSIS — M1612 Unilateral primary osteoarthritis, left hip: Secondary | ICD-10-CM | POA: Diagnosis not present

## 2024-09-27 DIAGNOSIS — M24152 Other articular cartilage disorders, left hip: Secondary | ICD-10-CM | POA: Diagnosis not present

## 2024-11-19 ENCOUNTER — Telehealth (HOSPITAL_BASED_OUTPATIENT_CLINIC_OR_DEPARTMENT_OTHER): Payer: Self-pay

## 2024-11-19 NOTE — Telephone Encounter (Signed)
 Created in error

## 2024-12-14 ENCOUNTER — Encounter: Payer: Self-pay | Admitting: Obstetrics & Gynecology

## 2024-12-14 ENCOUNTER — Ambulatory Visit: Admitting: Obstetrics & Gynecology

## 2024-12-14 VITALS — BP 124/75 | HR 85 | Ht 65.0 in | Wt 180.0 lb

## 2024-12-14 DIAGNOSIS — Z01419 Encounter for gynecological examination (general) (routine) without abnormal findings: Secondary | ICD-10-CM | POA: Diagnosis not present

## 2024-12-14 MED ORDER — ESTRADIOL 0.01 % VA CREA: TOPICAL_CREAM | VAGINAL | 11 refills | Status: AC

## 2024-12-14 NOTE — Progress Notes (Signed)
 Subjective:     Samantha Clarke is a 69 y.o. female here for a routine exam.  No LMP recorded. Patient has had a hysterectomy. G1P1001 Birth Control Method:  hysterectomy Menstrual Calendar(currently): na  Current complaints: left hip.   Current acute medical issues:  htn   Recent Gynecologic History No LMP recorded. Patient has had a hysterectomy. Last Pap: na,   Last mammogram: 12/2021, scheduled for next month,  normal  Past Medical History:  Diagnosis Date   Chronic constipation    Hyperlipidemia     Past Surgical History:  Procedure Laterality Date   ABDOMINAL HYSTERECTOMY     Anterior cervical diskectomy     with fusion of C6 to C7   CATARACT EXTRACTION, BILATERAL     COLONOSCOPY  12/05/2003   Rehman-Small external hemorrhoids, otherwise, normal but redundant colon with mild melanosis coli   COLONOSCOPY  10/26/2012   DOQ:Uymzz polyps measuring 2-4 mm in size were found in the ascending colon and rectum/ Small internal hemorrhoids   COLONOSCOPY WITH PROPOFOL  N/A 12/24/2022   Procedure: COLONOSCOPY WITH PROPOFOL ;  Surgeon: Cindie Carlin POUR, DO;  Location: [facility];  Service: Endoscopy;  Laterality: N/A;  11:45 am, pt knows to arrive at 7:00   PARTIAL HYSTERECTOMY     right finger surgery      OB History     Gravida  1   Para  1   Term  1   Preterm      AB      Living  1      SAB      IAB      Ectopic      Multiple      Live Births  1           Social History   Socioeconomic History   Marital status: Divorced    Spouse name: Not on file   Number of children: 1   Years of education: Not on file   Highest education level: Not on file  Occupational History   Occupation: Public Affairs Consultant: [employer]  Tobacco Use   Smoking status: Never   Smokeless tobacco: Never  Vaping Use   Vaping status: Never Used  Substance and Sexual Activity   Alcohol use: Yes    Comment: occ   Drug use: No   Sexual activity: Yes    Birth  control/protection: Surgical  Other Topics Concern   Not on file  Social History Narrative   Lives alone   Social Drivers of Health   Tobacco Use: Low Risk (12/14/2024)   Patient History    Smoking Tobacco Use: Never    Smokeless Tobacco Use: Never    Passive Exposure: Not on file  Financial Resource Strain: Not on file  Food Insecurity: Not on file  Transportation Needs: Not on file  Physical Activity: Not on file  Stress: Not on file  Social Connections: Not on file  Depression (EYV7-0): Not on file  Alcohol Screen: Not on file  Housing: Not on file  Utilities: Not on file  Health Literacy: Not on file    Family History  Problem Relation Age of Onset   Stroke Father    Heart failure Mother    Bone cancer Sister    Liver disease Sister    Stroke Sister    Colon cancer Neg Hx     Current Medications[1]  Review of Systems  Review of Systems  Constitutional: Negative for fever, chills,  weight loss, malaise/fatigue and diaphoresis.  HENT: Negative for hearing loss, ear pain, nosebleeds, congestion, sore throat, neck pain, tinnitus and ear discharge.   Eyes: Negative for blurred vision, double vision, photophobia, pain, discharge and redness.  Respiratory: Negative for cough, hemoptysis, sputum production, shortness of breath, wheezing and stridor.   Cardiovascular: Negative for chest pain, palpitations, orthopnea, claudication, leg swelling and PND.  Gastrointestinal: negative for abdominal pain. Negative for heartburn, nausea, vomiting, diarrhea, constipation, blood in stool and melena.  Genitourinary: Negative for dysuria, urgency, frequency, hematuria and flank pain.  Musculoskeletal: Negative for myalgias, back pain, joint pain and falls.  Skin: Negative for itching and rash.  Neurological: Negative for dizziness, tingling, tremors, sensory change, speech change, focal weakness, seizures, loss of consciousness, weakness and headaches.  Endo/Heme/Allergies: Negative  for environmental allergies and polydipsia. Does not bruise/bleed easily.  Psychiatric/Behavioral: Negative for depression, suicidal ideas, hallucinations, memory loss and substance abuse. The patient is not nervous/anxious and does not have insomnia.        Objective:  Blood pressure 124/75, pulse 85, height 5' 5 (1.651 m), weight 180 lb (81.6 kg).   Physical Exam  Vitals reviewed. Constitutional: She is oriented to person, place, and time. She appears well-developed and well-nourished.  HENT:  Head: Normocephalic and atraumatic.        Right Ear: External ear normal.  Left Ear: External ear normal.  Nose: Nose normal.  Mouth/Throat: Oropharynx is clear and moist.  Eyes: Conjunctivae and EOM are normal. Pupils are equal, round, and reactive to light. Right eye exhibits no discharge. Left eye exhibits no discharge. No scleral icterus.  Neck: Normal range of motion. Neck supple. No tracheal deviation present. No thyromegaly present.  Cardiovascular: Normal rate, regular rhythm, normal heart sounds and intact distal pulses.  Exam reveals no gallop and no friction rub.   No murmur heard. Respiratory: Effort normal and breath sounds normal. No respiratory distress. She has no wheezes. She has no rales. She exhibits no tenderness.  GI: Soft. Bowel sounds are normal. She exhibits no distension and no mass. There is no tenderness. There is no rebound and no guarding.  Genitourinary:  Breasts no masses skin changes or nipple changes bilaterally      Vulva is normal without lesions Vagina is pink moist without discharge Cervix absent Uterus is absent Adnexa is negative   Musculoskeletal: Normal range of motion. She exhibits no edema and no tenderness.  Neurological: She is alert and oriented to person, place, and time. She has normal reflexes. She displays normal reflexes. No cranial nerve deficit. She exhibits normal muscle tone. Coordination normal.  Skin: Skin is warm and dry. No rash  noted. No erythema. No pallor.  Psychiatric: She has a normal mood and affect. Her behavior is normal. Judgment and thought content normal.       Medications Ordered at today's visit: Meds ordered this encounter  Medications   estradiol  (ESTRACE ) 0.01 % CREA vaginal cream    Sig: 1 gram 3 times weekly    Dispense:  42.5 g    Refill:  11    Other orders placed at today's visit: No orders of the defined types were placed in this encounter.    ASSESSMENT + PLAN:    ICD-10-CM   1. Well woman exam with routine gynecological exam  Z01.419           Return if symptoms worsen or fail to improve.     [1]  Current Outpatient Medications:  amLODipine (NORVASC) 5 MG tablet, Take 5 mg by mouth daily., Disp: , Rfl:    aspirin 81 MG tablet, Take 81 mg by mouth daily., Disp: , Rfl:    [START ON 12/17/2024] estradiol  (ESTRACE ) 0.01 % CREA vaginal cream, 1 gram 3 times weekly, Disp: 42.5 g, Rfl: 11   ezetimibe (ZETIA) 10 MG tablet, Take 10 mg by mouth daily., Disp: , Rfl:    fenofibrate (TRICOR) 48 MG tablet, Take 48 mg by mouth daily., Disp: , Rfl:    Omega-3 Fatty Acids (OMEGA 3 PO), Take 1 capsule by mouth daily., Disp: , Rfl:    acetaminophen (TYLENOL) 500 MG tablet, Take 500 mg by mouth every 6 (six) hours as needed for moderate pain., Disp: , Rfl:    promethazine -dextromethorphan (PROMETHAZINE -DM) 6.25-15 MG/5ML syrup, Take 5 mLs by mouth 4 (four) times daily as needed., Disp: 118 mL, Rfl: 0

## 2024-12-17 ENCOUNTER — Ambulatory Visit: Attending: Cardiology | Admitting: Cardiology

## 2024-12-17 ENCOUNTER — Encounter: Payer: Self-pay | Admitting: Cardiology

## 2024-12-17 VITALS — BP 130/84 | HR 63 | Ht 65.0 in | Wt 177.0 lb

## 2024-12-17 DIAGNOSIS — Z01818 Encounter for other preprocedural examination: Secondary | ICD-10-CM

## 2024-12-17 DIAGNOSIS — R011 Cardiac murmur, unspecified: Secondary | ICD-10-CM

## 2024-12-17 NOTE — Patient Instructions (Signed)
 Medication Instructions:  Your physician recommends that you continue on your current medications as directed. Please refer to the Current Medication list given to you today.  *If you need a refill on your cardiac medications before your next appointment, please call your pharmacy*  Lab Work: None If you have labs (blood work) drawn today and your tests are completely normal, you will receive your results only by: MyChart Message (if you have MyChart) OR A paper copy in the mail If you have any lab test that is abnormal or we need to change your treatment, we will call you to review the results.  Testing/Procedures: Your physician has requested that you have an echocardiogram. Echocardiography is a painless test that uses sound waves to create images of your heart. It provides your doctor with information about the size and shape of your heart and how well your heart's chambers and valves are working. This procedure takes approximately one hour. There are no restrictions for this procedure. Please do NOT wear cologne, perfume, aftershave, or lotions (deodorant is allowed). Please arrive 15 minutes prior to your appointment time.  Please note: We ask at that you not bring children with you during ultrasound (echo/ vascular) testing. Due to room size and safety concerns, children are not allowed in the ultrasound rooms during exams. Our front office staff cannot provide observation of children in our lobby area while testing is being conducted. An adult accompanying a patient to their appointment will only be allowed in the ultrasound room at the discretion of the ultrasound technician under special circumstances. We apologize for any inconvenience.   Follow-Up: At [facility], you and your health needs are our priority.  As part of our continuing mission to provide you with exceptional heart care, our providers are all part of one team.  This team includes your primary Cardiologist  (physician) and Advanced Practice Providers or APPs (Physician Assistants and Nurse Practitioners) who all work together to provide you with the care you need, when you need it.  Your next appointment:   To Be Determined   Provider:   You may see Armida Lander, MD or one of the following Advanced Practice Providers on your designated Care Team:   Woodfin Hays, PA-C  [place], [place] Theotis Flake, [place]     We recommend signing up for the patient portal called "MyChart".  Sign up information is provided on this After Visit Summary.  MyChart is used to connect with patients for Virtual Visits (Telemedicine).  Patients are able to view lab/test results, encounter notes, upcoming appointments, etc.  Non-urgent messages can be sent to your provider as well.   To learn more about what you can do with MyChart, go to [email or web address].   Other Instructions

## 2024-12-17 NOTE — Progress Notes (Addendum)
 "     Clinical Summary Samantha Clarke is a 69 y.o.female seen today as a new consult, referred by Dr Leigh for the following medical problems.  1.Preoperative evaluation - considering ortho surgery, hip replacement.  - no prior cardiac history - no recent chest pains. No SOB/DOE - walks up to 20 minutes twice daily, no exertional symptoms - walks up flight of stairs at home at least 3 times daily   Past Medical History:  Diagnosis Date   Chronic constipation    Hyperlipidemia      Allergies[1]   Current Outpatient Medications  Medication Sig Dispense Refill   acetaminophen (TYLENOL) 500 MG tablet Take 500 mg by mouth every 6 (six) hours as needed for moderate pain.     amLODipine (NORVASC) 5 MG tablet Take 5 mg by mouth daily.     aspirin 81 MG tablet Take 81 mg by mouth daily.     estradiol  (ESTRACE ) 0.01 % CREA vaginal cream 1 gram 3 times weekly 42.5 g 11   ezetimibe (ZETIA) 10 MG tablet Take 10 mg by mouth daily.     fenofibrate (TRICOR) 48 MG tablet Take 48 mg by mouth daily.     Omega-3 Fatty Acids (OMEGA 3 PO) Take 1 capsule by mouth daily.     promethazine -dextromethorphan (PROMETHAZINE -DM) 6.25-15 MG/5ML syrup Take 5 mLs by mouth 4 (four) times daily as needed. 118 mL 0   No current facility-administered medications for this visit.     Past Surgical History:  Procedure Laterality Date   ABDOMINAL HYSTERECTOMY     Anterior cervical diskectomy     with fusion of C6 to C7   CATARACT EXTRACTION, BILATERAL     COLONOSCOPY  12/05/2003   Rehman-Small external hemorrhoids, otherwise, normal but redundant colon with mild melanosis coli   COLONOSCOPY  10/26/2012   DOQ:Uymzz polyps measuring 2-4 mm in size were found in the ascending colon and rectum/ Small internal hemorrhoids   COLONOSCOPY WITH PROPOFOL  N/A 12/24/2022   Procedure: COLONOSCOPY WITH PROPOFOL ;  Surgeon: Cindie Carlin POUR, DO;  [facility];  Service: Endoscopy;  Laterality: N/A;  11:45 am, pt  knows to arrive at 7:00   PARTIAL HYSTERECTOMY     right finger surgery       Allergies[2]    Family History  Problem Relation Age of Onset   Stroke Father    Heart failure Mother    Bone cancer Sister    Liver disease Sister    Stroke Sister    Colon cancer Neg Hx      Social History Samantha Clarke reports that she has never smoked. She has never used smokeless tobacco. Samantha Clarke reports current alcohol use.   Physical Examination Today's Vitals   12/17/24 1023  BP: 130/84  Pulse: 63  SpO2: 97%  Weight: 177 lb (80.3 kg)  Height: 5' 5 (1.651 m)   Body mass index is 29.45 kg/m.  Gen: resting comfortably, no acute distress HEENT: no scleral icterus, pupils equal round and reactive, no palptable cervical adenopathy,  CV: RRR, 2/6 systolic murmur rusb, no jvd Resp: Clear to auscultation bilaterally GI: abdomen is soft, non-tender, non-distended, normal bowel sounds, no hepatosplenomegaly MSK: extremities are warm, no edema.  Skin: warm, no rash Neuro:  no focal deficits Psych: appropriate affect   Assessment and Plan  1.Heart murmur - obtain echo  2. Preoperative evaluation - considering hip replacement - no active cardiac conditions - tolerates greater than 4 METs without exertional symptoms -  with mild heart murmur would f/u on echo prior to surgery, if benign echo ok to proceed with surgery from cardiac standpoint  EKG today shows NSR  F/u pending  12/28/24 addendum: Benign echo, recommend proceeding with surgery as planned.      Dorn PHEBE Ross, M.D.,     [1]  Allergies Allergen Reactions   Ceftriaxone Sodium Swelling   Linzess  [Linaclotide ]     HEART PALPITATIONS  [2]  Allergies Allergen Reactions   Ceftriaxone Sodium Swelling   Linzess  [Linaclotide ]     HEART PALPITATIONS   "

## 2024-12-27 ENCOUNTER — Ambulatory Visit (HOSPITAL_COMMUNITY)
Admission: RE | Admit: 2024-12-27 | Discharge: 2024-12-27 | Disposition: A | Discharge status: Home / Self Care | Source: Ambulatory Visit | Attending: Cardiology | Admitting: Cardiology

## 2024-12-27 DIAGNOSIS — R011 Cardiac murmur, unspecified: Secondary | ICD-10-CM | POA: Diagnosis not present

## 2024-12-27 LAB — ECHOCARDIOGRAM COMPLETE
Area-P 1/2: 2.91 cm2
S' Lateral: 3.4 cm

## 2024-12-28 ENCOUNTER — Ambulatory Visit: Payer: Self-pay | Admitting: Cardiology

## 2025-01-13 ENCOUNTER — Telehealth: Payer: Self-pay | Admitting: Cardiology

## 2025-01-13 NOTE — Telephone Encounter (Signed)
 Office calling to get a copy of patient's echo results. Fax [phone]. Please advise

## 2025-01-13 NOTE — Telephone Encounter (Signed)
Will send to Medical Records
